# Patient Record
Sex: Male | Born: 1995 | Race: Black or African American | Hispanic: No | Marital: Single | Smoking: Current some day smoker
Health system: Southern US, Community
[De-identification: ages and names within clinical notes are randomized; demographics above are authoritative.]

## PROBLEM LIST (undated history)

## (undated) DIAGNOSIS — F32A Depression, unspecified: Secondary | ICD-10-CM

## (undated) DIAGNOSIS — F329 Major depressive disorder, single episode, unspecified: Secondary | ICD-10-CM

## (undated) HISTORY — PX: TESTICULAR EXPLORATION: SHX5145

## (undated) HISTORY — DX: Depression, unspecified: F32.A

## (undated) HISTORY — DX: Major depressive disorder, single episode, unspecified: F32.9

---
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
## 2014-02-03 ENCOUNTER — Emergency Department: Payer: Self-pay | Admitting: Emergency Medicine

## 2014-02-03 LAB — COMPREHENSIVE METABOLIC PANEL
ALBUMIN: 4.8 g/dL (ref 3.8–5.6)
ALK PHOS: 39 U/L — AB
Anion Gap: 6 — ABNORMAL LOW (ref 7–16)
BUN: 14 mg/dL (ref 7–18)
Bilirubin,Total: 0.6 mg/dL (ref 0.2–1.0)
CALCIUM: 9.4 mg/dL (ref 9.0–10.7)
Chloride: 109 mmol/L — ABNORMAL HIGH (ref 98–107)
Co2: 25 mmol/L (ref 21–32)
Creatinine: 1.26 mg/dL (ref 0.60–1.30)
EGFR (African American): >60
Glucose: 92 mg/dL (ref 65–99)
Osmolality: 280 (ref 275–301)
POTASSIUM: 3.6 mmol/L (ref 3.5–5.1)
SGOT(AST): 19 U/L (ref 10–41)
SGPT (ALT): 27 U/L
SODIUM: 140 mmol/L (ref 136–145)
Total Protein: 7.9 g/dL (ref 6.4–8.6)

## 2014-02-03 LAB — CBC WITH DIFFERENTIAL/PLATELET
Basophil #: 0 10*3/uL (ref 0.0–0.1)
Basophil %: 1.3 %
EOS ABS: 0.1 10*3/uL (ref 0.0–0.7)
EOS PCT: 1.8 %
HCT: 44.6 % (ref 40.0–52.0)
HGB: 13.3 g/dL (ref 13.0–18.0)
Lymphocyte #: 1.3 10*3/uL (ref 1.0–3.6)
Lymphocyte %: 35.7 %
MCH: 20.5 pg — ABNORMAL LOW (ref 26.0–34.0)
MCHC: 29.8 g/dL — ABNORMAL LOW (ref 32.0–36.0)
MCV: 69 fL — ABNORMAL LOW (ref 80–100)
MONO ABS: 0.4 x10 3/mm (ref 0.2–1.0)
Monocyte %: 10.6 %
Neutrophil #: 1.8 10*3/uL (ref 1.4–6.5)
Neutrophil %: 50.6 %
Platelet: 251 10*3/uL (ref 150–440)
RBC: 6.51 10*6/uL — ABNORMAL HIGH (ref 4.40–5.90)
RDW: 15.2 % — ABNORMAL HIGH (ref 11.5–14.5)
WBC: 3.6 10*3/uL — ABNORMAL LOW (ref 3.8–10.6)

## 2014-02-03 LAB — URINALYSIS, COMPLETE
BACTERIA: NONE SEEN
BLOOD: NEGATIVE
Bilirubin,UR: NEGATIVE
Glucose,UR: NEGATIVE mg/dL (ref 0–75)
KETONE: NEGATIVE
Leukocyte Esterase: NEGATIVE
Nitrite: NEGATIVE
Ph: 7 (ref 4.5–8.0)
Protein: 30
Specific Gravity: 1.029 (ref 1.003–1.030)
Squamous Epithelial: 2

## 2014-07-07 DIAGNOSIS — N529 Male erectile dysfunction, unspecified: Secondary | ICD-10-CM | POA: Insufficient documentation

## 2014-07-07 DIAGNOSIS — Z87438 Personal history of other diseases of male genital organs: Secondary | ICD-10-CM | POA: Insufficient documentation

## 2014-07-07 DIAGNOSIS — N50819 Testicular pain, unspecified: Secondary | ICD-10-CM | POA: Insufficient documentation

## 2014-09-08 ENCOUNTER — Emergency Department: Admit: 2014-09-08 | Discharge status: Against Medical Advice | Payer: Self-pay | Admitting: Emergency Medicine

## 2014-09-08 LAB — CBC WITH DIFFERENTIAL/PLATELET
Basophil #: 0.1 x10 3/mm 3
Basophil %: 1.2 %
Eosinophil #: 0.1 x10 3/mm 3
Eosinophil %: 1.4 %
HCT: 45.4 %
HGB: 14 g/dL
Lymphocyte %: 26.3 %
Lymphs Abs: 1.1 x10 3/mm 3
MCH: 21 pg — ABNORMAL LOW
MCHC: 30.9 g/dL — ABNORMAL LOW
MCV: 68 fL — ABNORMAL LOW
Monocyte #: 0.5 "x10 3/mm "
Monocyte %: 12 %
Neutrophil #: 2.5 x10 3/mm 3
Neutrophil %: 59.1 %
Platelet: 283 x10 3/mm 3
RBC: 6.68 x10 6/mm 3 — ABNORMAL HIGH
RDW: 16.3 % — ABNORMAL HIGH
WBC: 4.2 x10 3/mm 3

## 2014-09-08 LAB — COMPREHENSIVE METABOLIC PANEL
ALT: 31 U/L
ANION GAP: 10 (ref 7–16)
Albumin: 5.3 g/dL — ABNORMAL HIGH
Alkaline Phosphatase: 37 U/L — ABNORMAL LOW
BILIRUBIN TOTAL: 0.5 mg/dL
BUN: 16 mg/dL
CHLORIDE: 106 mmol/L
CREATININE: 1.36 mg/dL — AB
Calcium, Total: 9.9 mg/dL
Co2: 26 mmol/L
EGFR (African American): >60
GLUCOSE: 105 mg/dL — AB
Potassium: 3.8 mmol/L
SGOT(AST): 37 U/L
Sodium: 142 mmol/L
TOTAL PROTEIN: 8.2 g/dL — AB

## 2014-09-08 LAB — URINALYSIS, COMPLETE
BILIRUBIN, UR: NEGATIVE
Bacteria: NONE SEEN
Blood: NEGATIVE
Glucose,UR: NEGATIVE mg/dL (ref 0–75)
KETONE: NEGATIVE
LEUKOCYTE ESTERASE: NEGATIVE
Nitrite: NEGATIVE
Ph: 5 (ref 4.5–8.0)
Protein: NEGATIVE
Specific Gravity: 1.03 (ref 1.003–1.030)

## 2014-09-08 LAB — LIPASE, BLOOD: Lipase: 61 U/L — ABNORMAL HIGH

## 2014-11-04 ENCOUNTER — Emergency Department
Admission: EM | Admit: 2014-11-04 | Discharge: 2014-11-04 | Disposition: A | Discharge status: Home / Self Care | Payer: BLUE CROSS/BLUE SHIELD | Attending: Emergency Medicine | Admitting: Emergency Medicine

## 2014-11-04 ENCOUNTER — Emergency Department: Payer: BLUE CROSS/BLUE SHIELD

## 2014-11-04 ENCOUNTER — Encounter: Payer: Self-pay | Admitting: *Deleted

## 2014-11-04 DIAGNOSIS — R634 Abnormal weight loss: Secondary | ICD-10-CM | POA: Diagnosis not present

## 2014-11-04 DIAGNOSIS — Z720 Tobacco use: Secondary | ICD-10-CM | POA: Diagnosis not present

## 2014-11-04 DIAGNOSIS — Z79899 Other long term (current) drug therapy: Secondary | ICD-10-CM | POA: Insufficient documentation

## 2014-11-04 DIAGNOSIS — R112 Nausea with vomiting, unspecified: Secondary | ICD-10-CM

## 2014-11-04 LAB — COMPREHENSIVE METABOLIC PANEL
ALT: 20 U/L (ref 17–63)
AST: 21 U/L (ref 15–41)
Albumin: 4.8 g/dL (ref 3.5–5.0)
Alkaline Phosphatase: 36 U/L — ABNORMAL LOW (ref 38–126)
Anion gap: 12 (ref 5–15)
BUN: 16 mg/dL (ref 6–20)
CALCIUM: 9.4 mg/dL (ref 8.9–10.3)
CO2: 26 mmol/L (ref 22–32)
Chloride: 103 mmol/L (ref 101–111)
Creatinine, Ser: 1.42 mg/dL — ABNORMAL HIGH (ref 0.61–1.24)
GFR calc Af Amer: >60 mL/min (ref >60)
Glucose, Bld: 92 mg/dL (ref 65–99)
Potassium: 3.9 mmol/L (ref 3.5–5.1)
SODIUM: 141 mmol/L (ref 135–145)
Total Bilirubin: 0.7 mg/dL (ref 0.3–1.2)
Total Protein: 7.6 g/dL (ref 6.5–8.1)

## 2014-11-04 LAB — CBC WITH DIFFERENTIAL/PLATELET
BASOS PCT: 0 %
Basophils Absolute: 0 10*3/uL (ref 0–0.1)
EOS ABS: 0.1 10*3/uL (ref 0–0.7)
Eosinophils Relative: 2 %
HEMATOCRIT: 42.6 % (ref 40.0–52.0)
Hemoglobin: 13.3 g/dL (ref 13.0–18.0)
Lymphocytes Relative: 37 %
Lymphs Abs: 1.5 10*3/uL (ref 1.0–3.6)
MCH: 21 pg — AB (ref 26.0–34.0)
MCHC: 31.1 g/dL — AB (ref 32.0–36.0)
MCV: 67.6 fL — AB (ref 80.0–100.0)
MONO ABS: 0.4 10*3/uL (ref 0.2–1.0)
Monocytes Relative: 10 %
NEUTROS ABS: 2.1 10*3/uL (ref 1.4–6.5)
Neutrophils Relative %: 51 %
Platelets: 239 10*3/uL (ref 150–440)
RBC: 6.31 MIL/uL — ABNORMAL HIGH (ref 4.40–5.90)
RDW: 16 % — AB (ref 11.5–14.5)
WBC: 4.1 10*3/uL (ref 3.8–10.6)

## 2014-11-04 LAB — LIPASE, BLOOD: Lipase: 46 U/L (ref 22–51)

## 2014-11-04 MED ORDER — METOCLOPRAMIDE HCL 5 MG PO TABS
5.0000 mg | ORAL_TABLET | Freq: Once | ORAL | Status: AC
  Administered 2014-11-04: 5 mg via ORAL
  Filled 2014-11-04: qty 1

## 2014-11-04 MED ORDER — METOCLOPRAMIDE HCL 5 MG PO TABS: 5.0000 mg | ORAL_TABLET | Freq: Three times a day (TID) | ORAL | Status: DC

## 2014-11-04 NOTE — ED Provider Notes (Signed)
[facility] Emergency Department Provider Note  ____________________________________________  Time seen: 1920  I have reviewed the triage vital signs and the nursing notes.   HISTORY  Chief Complaint Emesis     HPI Francisco [name] is a 19 y.o. male who reports that for the past 2-3 months he has had a problem where he sometimes vomits after he eats. He reports he does not have much nausea. The emesis sneaks up on him somewhat. This is sometimes 30 minutes after a meal. He feels he is throwing up the entire portion of food that he ate and not just a part of the serving. He will then have some ongoing nausea which resolves.    in addition to this emesis issue, he often feels as though he is getting full early when eating a meal. He denies any abdominal pain. He is having normal bowel movements without constipation.  Francisco [name] reports that he has dropped nearly 40 pounds in 3-4 months. He is gone from 230 pounds to approximately 190.   History reviewed. No pertinent past medical history.  There are no active problems to display for this patient.   Past Surgical History  Procedure Laterality Date  . Testicular exploration      Current Outpatient Rx  Name  Route  Sig  Dispense  Refill  . metoCLOPramide (REGLAN) 5 MG tablet   Oral   Take 1 tablet (5 mg total) by mouth 3 (three) times daily.   15 tablet   0     Allergies Review of patient's allergies indicates no known allergies.  History reviewed. No pertinent family history.  Social History History  Substance Use Topics  . Smoking status: Current Every Day Smoker    Types: Cigarettes  . Smokeless tobacco: Never Used  . Alcohol Use: No    Review of Systems  Constitutional: Negative for fever. ENT: Negative for sore throat. Cardiovascular: Negative for chest pain. Respiratory: Negative for shortness of breath. Gastrointestinal: Vomiting after eating, limited appetite, see history of  present illness. Genitourinary: Patient reports he has had difficulty with erection for approximately 1 year. Musculoskeletal: No myalgias or injuries. Skin: Negative for rash. Neurological: Negative for headaches   10-point ROS otherwise negative.  ____________________________________________   PHYSICAL EXAM:  VITAL SIGNS: ED Triage Vitals  Enc Vitals Group     BP 11/04/14 1735 152/80 mmHg     Pulse Rate 11/04/14 1735 65     Resp 11/04/14 1735 16     Temp 11/04/14 1735 98.2 F (36.8 C)     Temp Source 11/04/14 1735 Oral     SpO2 11/04/14 1735 100 %     Weight 11/04/14 1735 191 lb (86.637 kg)     Height 11/04/14 1735 5\' 8"  (1.727 m)     Head Cir --      Peak Flow --      Pain Score --      Pain Loc --      Pain Edu? --      Excl. in GC? --     Constitutional: Alert and oriented. Well appearing and in no distress. ENT   Head: Normocephalic and atraumatic.   Nose: No congestion/rhinnorhea.   Mouth/Throat: Mucous membranes are moist. Cardiovascular: Normal rate, regular rhythm, no murmur noted Respiratory:  Normal respiratory effort, no tachypnea.    Breath sounds are clear and equal bilaterally.  Gastrointestinal: Soft and nontender. No distention.  Back: No muscle spasm, no tenderness, no CVA tenderness.  Musculoskeletal: No deformity noted. Nontender with normal range of motion in all extremities.  No noted edema. Neurologic:  Normal speech and language. No gross focal neurologic deficits are appreciated.  Skin:  Skin is warm, dry. No rash noted. Psychiatric: Mood and affect are normal. Speech and behavior are normal.  ____________________________________________    LABS (pertinent positives/negatives)  CBC: White blood cell count 4.1, hemoglobin 13.3 Metabolic panel: Within normal limits except for slight elevation of creatinine at 1.42, LFTs are normal. Lipase: Normal at 46   ____________________________________________    RADIOLOGY  Abdominal  x-ray: IMPRESSION: Negative abdominal radiographs. No acute cardiopulmonary disease.  ____________________________________________   INITIAL IMPRESSION / ASSESSMENT AND PLAN / ED COURSE  Pertinent labs & imaging results that were available during my care of the patient were reviewed by me and considered in my medical decision making (see chart for details).   This patient overall looks well. He is in no acute distress. We will obtain an abdominal x-ray has a reasonable preliminary imaging. If this is unremarkable, we will discharge him for outpatient follow-up. I discussed how he will need to see a gastroenterologist for ongoing care. Reglan may help him with motility. We will give him 5 mg now and a prescription to take before meals. I counseled him to keep his meals to a small size. He tells me he has gone to [facility] in the past, last seen last year,  and he can follow-up with them.  ----------------------------------------- 8:17 PM on 11/04/2014 -----------------------------------------  Labs and an abdominal x-ray of the patient appeared generally normal. We will prescribe Reglan and have him follow with [facility] with the possibility of follow-up with gastroenterology.  ____________________________________________   FINAL CLINICAL IMPRESSION(S) / ED DIAGNOSES  Final diagnoses:  Non-intractable vomiting with nausea, vomiting of unspecified type  Weight loss, unintentional      Darien Ramus, MD 11/04/14 2021

## 2014-11-04 NOTE — ED Notes (Signed)
Pt states that everytime he eats he has to vomit for a couple months now. Pt reports going from 230-191lb. Pt denies abd pain

## 2014-11-04 NOTE — Discharge Instructions (Signed)
It is unclear why you are vomiting when you eat. He may need see gastroenterologist and have endoscopy. Take Reglan 30 minutes before you eat. Keep your meals small. Drink fluids. Follow-up with [facility]. They could help arrange further care for you.  Return to the emergency department if you can't keep anything down, have ongoing vomiting, have ongoing abdominal pain, or if you have other urgent concerns.  Nausea and Vomiting Nausea means you feel sick to your stomach. Throwing up (vomiting) is a reflex where stomach contents come out of your mouth. HOME CARE   Take medicine as told by your doctor.  Do not force yourself to eat. However, you do need to drink fluids.  If you feel like eating, eat a normal diet as told by your doctor.  Eat rice, wheat, potatoes, bread, lean meats, yogurt, fruits, and vegetables.  Avoid high-fat foods.  Drink enough fluids to keep your pee (urine) clear or pale yellow.  Ask your doctor how to replace body fluid losses (rehydrate). Signs of body fluid loss (dehydration) include:  Feeling very thirsty.  Dry lips and mouth.  Feeling dizzy.  Dark pee.  Peeing less than normal.  Feeling confused.  Fast breathing or heart rate. GET HELP RIGHT AWAY IF:   You have blood in your throw up.  You have black or bloody poop (stool).  You have a bad headache or stiff neck.  You feel confused.  You have bad belly (abdominal) pain.  You have chest pain or trouble breathing.  You do not pee at least once every 8 hours.  You have cold, clammy skin.  You keep throwing up after 24 to 48 hours.  You have a fever. MAKE SURE YOU:   Understand these instructions.  Will watch your condition.  Will get help right away if you are not doing well or get worse. Document Released: 10/17/2007 Document Revised: 07/23/2011 Document Reviewed: 09/29/2010 [facility] Patient Information 2015 [employer], [place]. This information is not intended to replace advice  given to you by your health care provider. Make sure you discuss any questions you have with your health care provider.

## 2014-11-04 NOTE — ED Notes (Addendum)
Patient states he has been having difficulty eating as much as he used to or would vomit afterwards. Patient states he has lost weight from 230lbs to 191lbs in approximately two months. Patient denies pain with the episodes. Patient c/o nausea intermittently.

## 2015-01-06 ENCOUNTER — Encounter: Payer: Self-pay | Admitting: Emergency Medicine

## 2015-01-06 ENCOUNTER — Emergency Department
Admission: EM | Admit: 2015-01-06 | Discharge: 2015-01-07 | Disposition: A | Discharge status: Other Acute Inpatient Hospital | Payer: BLUE CROSS/BLUE SHIELD | Attending: Emergency Medicine | Admitting: Emergency Medicine

## 2015-01-06 DIAGNOSIS — Z720 Tobacco use: Secondary | ICD-10-CM | POA: Insufficient documentation

## 2015-01-06 DIAGNOSIS — R45851 Suicidal ideations: Secondary | ICD-10-CM

## 2015-01-06 DIAGNOSIS — Z79899 Other long term (current) drug therapy: Secondary | ICD-10-CM | POA: Insufficient documentation

## 2015-01-06 DIAGNOSIS — F1210 Cannabis abuse, uncomplicated: Secondary | ICD-10-CM | POA: Diagnosis not present

## 2015-01-06 DIAGNOSIS — F4325 Adjustment disorder with mixed disturbance of emotions and conduct: Secondary | ICD-10-CM | POA: Diagnosis not present

## 2015-01-06 DIAGNOSIS — F919 Conduct disorder, unspecified: Secondary | ICD-10-CM | POA: Diagnosis present

## 2015-01-06 NOTE — ED Notes (Signed)
Pt. Brought in by [employer].  Pt. Denies pain at this time.  Pt. States "I have a lot of things going on".

## 2015-01-07 DIAGNOSIS — F4325 Adjustment disorder with mixed disturbance of emotions and conduct: Secondary | ICD-10-CM

## 2015-01-07 LAB — CBC
HCT: 42.9 % (ref 40.0–52.0)
HEMOGLOBIN: 13.4 g/dL (ref 13.0–18.0)
MCH: 21 pg — ABNORMAL LOW (ref 26.0–34.0)
MCHC: 31.3 g/dL — ABNORMAL LOW (ref 32.0–36.0)
MCV: 67 fL — ABNORMAL LOW (ref 80.0–100.0)
Platelets: 263 10*3/uL (ref 150–440)
RBC: 6.4 MIL/uL — AB (ref 4.40–5.90)
RDW: 16.1 % — ABNORMAL HIGH (ref 11.5–14.5)
WBC: 7.9 10*3/uL (ref 3.8–10.6)

## 2015-01-07 LAB — URINE DRUG SCREEN, QUALITATIVE (ARMC ONLY)
AMPHETAMINES, UR SCREEN: NOT DETECTED
Amphetamines, Ur Screen: NOT DETECTED
Barbiturates, Ur Screen: NOT DETECTED
Barbiturates, Ur Screen: NOT DETECTED
Benzodiazepine, Ur Scrn: NOT DETECTED
Benzodiazepine, Ur Scrn: NOT DETECTED
COCAINE METABOLITE, UR ~~LOC~~: NOT DETECTED
Cannabinoid 50 Ng, Ur ~~LOC~~: POSITIVE — AB
Cannabinoid 50 Ng, Ur ~~LOC~~: POSITIVE — AB
Cocaine Metabolite,Ur ~~LOC~~: NOT DETECTED
MDMA (ECSTASY) UR SCREEN: NOT DETECTED
MDMA (ECSTASY) UR SCREEN: NOT DETECTED
METHADONE SCREEN, URINE: NOT DETECTED
METHADONE SCREEN, URINE: NOT DETECTED
Opiate, Ur Screen: NOT DETECTED
Opiate, Ur Screen: NOT DETECTED
Phencyclidine (PCP) Ur S: NOT DETECTED
Phencyclidine (PCP) Ur S: NOT DETECTED
TRICYCLIC, UR SCREEN: NOT DETECTED
Tricyclic, Ur Screen: NOT DETECTED

## 2015-01-07 LAB — COMPREHENSIVE METABOLIC PANEL
ALBUMIN: 5.1 g/dL — AB (ref 3.5–5.0)
ALK PHOS: 35 U/L — AB (ref 38–126)
ALT: 16 U/L — AB (ref 17–63)
ANION GAP: 8 (ref 5–15)
AST: 21 U/L (ref 15–41)
BUN: 16 mg/dL (ref 6–20)
CHLORIDE: 109 mmol/L (ref 101–111)
CO2: 23 mmol/L (ref 22–32)
Calcium: 9.6 mg/dL (ref 8.9–10.3)
Creatinine, Ser: 1.19 mg/dL (ref 0.61–1.24)
GFR calc non Af Amer: >60 mL/min (ref >60)
Glucose, Bld: 92 mg/dL (ref 65–99)
Potassium: 3.5 mmol/L (ref 3.5–5.1)
SODIUM: 140 mmol/L (ref 135–145)
Total Bilirubin: 0.7 mg/dL (ref 0.3–1.2)
Total Protein: 8.1 g/dL (ref 6.5–8.1)

## 2015-01-07 LAB — SALICYLATE LEVEL

## 2015-01-07 LAB — ACETAMINOPHEN LEVEL

## 2015-01-07 LAB — ETHANOL: Alcohol, Ethyl (B): <5 mg/dL (ref <5)

## 2015-01-07 NOTE — ED Provider Notes (Signed)
-----------------------------------------   4:01 PM on 01/07/2015 -----------------------------------------   Blood pressure 140/78, pulse 78, temperature 98.9 F (37.2 C), temperature source Oral, resp. rate 18, SpO2 100 %.  Dr. Toni Amend evaluated the patient personally and feels that he is safe for discharge.  He has revoked the involuntary commitment.  The patient can follow-up with [facility].  Loleta Rose, MD 01/07/15 [phone]

## 2015-01-07 NOTE — ED Notes (Signed)
Patient discharged ambulatory to care of family. He denies SI or HI. Received all personal belongings.

## 2015-01-07 NOTE — ED Notes (Signed)
Patient resting comfortably in room. No complaints or concerns voiced. No distress or abnormal behavior noted. Will continue to monitor with security cameras. Q 15 minute rounds continue. 

## 2015-01-07 NOTE — ED Notes (Signed)
ENVIRONMENTAL ASSESSMENT Potentially harmful objects out of patient reach: Yes Personal belongings secured: Yes Patient dressed in hospital provided attire only: Yes Plastic bags out of patient reach: Yes Patient care equipment (cords, cables, call bells, lines, and drains) shortened, removed, or accounted for: Yes Equipment and supplies removed from bottom of stretcher: Yes Potentially toxic materials out of patient reach: N/A Sharps container removed or out of patient reach: Yes

## 2015-01-07 NOTE — ED Notes (Signed)
Report called to RN Jillyn Hidden in [place]. Pt transferred to [facility] with ED Roque Cash and ODS Officer Earl Lites.

## 2015-01-07 NOTE — ED Notes (Signed)
Pt. Noted sleeping in room. No complaints or concerns voiced. No distress or abnormal behavior noted. Will continue to monitor with security cameras. Q 15 minute rounds continue. 

## 2015-01-07 NOTE — BH Assessment (Signed)
Assessment Note  Francisco [name] is an 19 y.o. male presenting to the ED under IVC for suicidal ideations with a plan and intent to cut his wrists.  Pt reports multiple stressors: lack of income, job loss, recent death of grandmother, concern over medical issues and relationship issues with his girlfriend.  During the assessment, pt repeatedly stated that "I'm calm now and I want to go home".  Pt denies any drug/alcohol use.  Pt has not had any previous psychiatric hospitalization.  Pt agreed that he should seek out outpatient therapy to help cope with the difficulties he's experiencing.  Axis I: Anxiety Disorder NOS and Depressive Disorder NOS Axis II: Deferred Axis III: History reviewed. No pertinent past medical history. Axis IV: economic problems and problems with primary support group Axis V: 61-70 mild symptoms  Past Medical History: History reviewed. No pertinent past medical history.  Past Surgical History  Procedure Laterality Date  . Testicular exploration      Family History: History reviewed. No pertinent family history.  Social History:  reports that he has been smoking Cigarettes.  He has been smoking about 0.50 packs per day. He has never used smokeless tobacco. He reports that he does not drink alcohol or use illicit drugs.  Additional Social History:  Alcohol / Drug Use History of alcohol / drug use?: No history of alcohol / drug abuse  CIWA:   COWS:    Allergies: No Known Allergies  Home Medications:  (Not in a hospital admission)  OB/GYN Status:  No LMP for male patient.  General Assessment Data Location of Assessment: [facility] ED TTS Assessment: In system Is this a Tele or Face-to-Face Assessment?: Face-to-Face Is this an Initial Assessment or a Re-assessment for this encounter?: Initial Assessment Marital status: Single Maiden name: N/A Is patient pregnant?: No Pregnancy Status: No Living Arrangements: Parent Can pt return to current living arrangement?:  Yes Admission Status: Involuntary Is patient capable of signing voluntary admission?: No Referral Source: Self/Family/Friend Insurance type: BC/BS     Crisis Care Plan Living Arrangements: Parent Name of Psychiatrist: None Reported Name of Therapist: None Reported  Education Status Is patient currently in school?: No Current Grade: N/A Highest grade of school patient has completed: 12 Name of school: [employer] person: N/A  Risk to self with the past 6 months Suicidal Ideation: Yes-Currently Present Has patient been a risk to self within the past 6 months prior to admission? : Yes Suicidal Intent: Yes-Currently Present Has patient had any suicidal intent within the past 6 months prior to admission? : Yes Is patient at risk for suicide?: Yes Suicidal Plan?: Yes-Currently Present Has patient had any suicidal plan within the past 6 months prior to admission? : Yes Specify Current Suicidal Plan: Pt had a plan to cut his wrist. Access to Means: Yes Specify Access to Suicidal Means: Patient has access to knives What has been your use of drugs/alcohol within the last 12 months?: None reported Previous Attempts/Gestures: No How many times?: 0 Other Self Harm Risks: None reported Triggers for Past Attempts: None known Intentional Self Injurious Behavior: None Family Suicide History: No Recent stressful life event(s): Job Loss, Financial Problems, Loss (Comment), Other (Comment) (Relationship issues, death of grandmother) Persecutory voices/beliefs?: No Depression: Yes Depression Symptoms: Despondent, Loss of interest in usual pleasures, Feeling worthless/self pity, Feeling angry/irritable Substance abuse history and/or treatment for substance abuse?: No Suicide prevention information given to non-admitted patients: Not applicable  Risk to Others within the past 6 months Homicidal  Ideation: No Does patient have any lifetime risk of violence toward others beyond the  six months prior to admission? : No Thoughts of Harm to Others: No Current Homicidal Intent: No Current Homicidal Plan: No Access to Homicidal Means: No Identified Victim: None reported History of harm to others?: No Assessment of Violence: On admission Violent Behavior Description: None reported Does patient have access to weapons?: Yes (Comment) Criminal Charges Pending?: No Does patient have a court date: No Is patient on probation?: No  Psychosis Hallucinations: None noted Delusions: None noted  Mental Status Report Appearance/Hygiene: In scrubs Eye Contact: Good Motor Activity: Restlessness, Freedom of movement, Agitation Speech: Logical/coherent, Soft Level of Consciousness: Restless, Alert Mood: Anxious, Suspicious, Apprehensive, Irritable Affect: Anxious, Irritable Anxiety Level: Minimal Thought Processes: Coherent, Relevant Judgement: Partial Orientation: Person, Place, Time, Situation Obsessive Compulsive Thoughts/Behaviors: None  Cognitive Functioning Concentration: Normal Memory: Recent Intact IQ: Average Insight: Fair Impulse Control: Poor Appetite: Good Weight Loss: 0 Weight Gain: 0 Sleep: No Change Total Hours of Sleep: 8 Vegetative Symptoms: None  ADLScreening [facility] Assessment Services) Patient's cognitive ability adequate to safely complete daily activities?: Yes Patient able to express need for assistance with ADLs?: Yes Independently performs ADLs?: Yes (appropriate for developmental age)  Prior Inpatient Therapy Prior Inpatient Therapy: No  Prior Outpatient Therapy Prior Outpatient Therapy: No Does patient have an ACCT team?: No Does patient have Intensive In-House Services?  : No Does patient have [facility] services? : No Does patient have P4CC services?: No  ADL Screening (condition at time of admission) Patient's cognitive ability adequate to safely complete daily activities?: Yes Patient able to express need for assistance with ADLs?:  Yes Independently performs ADLs?: Yes (appropriate for developmental age)       Abuse/Neglect Assessment (Assessment to be complete while patient is alone) Physical Abuse: Denies Verbal Abuse: Denies Sexual Abuse: Denies Exploitation of patient/patient's resources: Denies Self-Neglect: Denies Values / Beliefs Cultural Requests During Hospitalization: None Spiritual Requests During Hospitalization: None Consults Spiritual Care Consult Needed: No Social Work Consult Needed: No Merchant navy officer (For Healthcare) Does patient have an advance directive?: No    Additional Information 1:1 In Past 12 Months?: No CIRT Risk: No Elopement Risk: No     Disposition:  Disposition Initial Assessment Completed for this Encounter: Yes Disposition of Patient: Other dispositions Other disposition(s): Other (Comment) (Psych MD Consult)  On Site Evaluation by:   Reviewed with Physician:    Tashona Calk C Samary Shatz 01/07/2015 1:05 AM

## 2015-01-07 NOTE — BHH Counselor (Signed)
Per request oToni AmendYC MD (Dr. Clapacs), writer provided the pt. with information and instructions on how to access Out Pt. Mental Health Treatment ([facility] and [employer])

## 2015-01-07 NOTE — ED Notes (Signed)
Patient visited with mother. Visit did not go well as patient blaming mother for IVC. Visit was cut short by RN. Patient was able to maintain in behavioral control with encouragement.

## 2015-01-07 NOTE — ED Notes (Signed)
Pt in room. No complaints or concerns voiced at this time. No abnormal behavior noted at this time. Will continue to monitor with q15 min checks. ODS officer in area. 

## 2015-01-07 NOTE — ED Notes (Signed)
BEHAVIORAL HEALTH ROUNDING Patient sleeping: No. Patient alert and oriented: yes Behavior appropriate: Yes.   Nutrition and fluids offered: Yes  Toileting and hygiene offered: Yes  Sitter present: q15 min observations Law enforcement present: Yes [employer] 

## 2015-01-07 NOTE — ED Notes (Signed)
Clapacs with pt  

## 2015-01-07 NOTE — ED Provider Notes (Signed)
[facility] Emergency Department Provider Note  ____________________________________________  Time seen: 1:00 AM  I have reviewed the triage vital signs and the nursing notes.   HISTORY  Chief Complaint Behavior Problem      HPI Francisco [name] is a 19 y.o. male presents via [employer] Department involuntarily committed for suicidal ideation. Patient admits to what is dated on the involuntary commitment namely that the patient had a knife and was threatening to cut his wrists. Patient states that he no longer has any suicidal ideation. Patient states that he was dealing with "the death of his grandmother and the imminent death of his other grandmother very poorly". Patient is requesting to be discharged stating that he is "fine now, I just didn't handle it well".     History reviewed. No pertinent past medical history.  There are no active problems to display for this patient.   Past Surgical History  Procedure Laterality Date  . Testicular exploration      Current Outpatient Rx  Name  Route  Sig  Dispense  Refill  . metoCLOPramide (REGLAN) 5 MG tablet   Oral   Take 1 tablet (5 mg total) by mouth 3 (three) times daily.   15 tablet   0     Allergies Review of patient's allergies indicates no known allergies.  History reviewed. No pertinent family history.  Social History Social History  Substance Use Topics  . Smoking status: Current Every Day Smoker -- 0.50 packs/day    Types: Cigarettes  . Smokeless tobacco: Never Used  . Alcohol Use: No    Review of Systems  Constitutional: Negative for fever. Eyes: Negative for visual changes. ENT: Negative for sore throat. Cardiovascular: Negative for chest pain. Respiratory: Negative for shortness of breath. Gastrointestinal: Negative for abdominal pain, vomiting and diarrhea. Genitourinary: Negative for dysuria. Musculoskeletal: Negative for back pain. Skin: Negative for  rash. Neurological: Negative for headaches, focal weakness or numbness. Psychiatric: Depression  10-point ROS otherwise negative.  ____________________________________________   PHYSICAL EXAM:  VITAL SIGNS: ED Triage Vitals  Enc Vitals Group     BP --      Pulse --      Resp --      Temp --      Temp src --      SpO2 --      Weight --      Height --      Head Cir --      Peak Flow --      Pain Score 01/06/15 2251 0     Pain Loc --      Pain Edu? --      Excl. in GC? --      Constitutional: Alert and oriented. Well appearing and in no distress. Eyes: Conjunctivae are normal. PERRL. Normal extraocular movements. ENT   Head: Normocephalic and atraumatic.   Nose: No congestion/rhinnorhea.   Mouth/Throat: Mucous membranes are moist.   Neck: No stridor. Hematological/Lymphatic/Immunilogical: No cervical lymphadenopathy. Cardiovascular: Normal rate, regular rhythm. Normal and symmetric distal pulses are present in all extremities. No murmurs, rubs, or gallops. Respiratory: Normal respiratory effort without tachypnea nor retractions. Breath sounds are clear and equal bilaterally. No wheezes/rales/rhonchi. Gastrointestinal: Soft and nontender. No distention. There is no CVA tenderness. Genitourinary: deferred Musculoskeletal: Nontender with normal range of motion in all extremities. No joint effusions.  No lower extremity tenderness nor edema. Neurologic:  Normal speech and language. No gross focal neurologic deficits are appreciated. Speech is  normal.  Skin:  Skin is warm, dry and intact. No rash noted. Psychiatric: Mood and affect are normal. Speech and behavior are normal. Patient exhibits appropriate insight and judgment.  ____________________________________________    LABS (pertinent positives/negatives)  Labs Reviewed  URINE DRUG SCREEN, QUALITATIVE ([facility] ONLY) - Abnormal; Notable for the following:    Cannabinoid 50 Ng, Ur [place] POSITIVE (*)    All  other components within normal limits  COMPREHENSIVE METABOLIC PANEL  ETHANOL  SALICYLATE LEVEL  ACETAMINOPHEN LEVEL  CBC  URINE RAPID DRUG SCREEN, HOSP PERFORMED       INITIAL IMPRESSION / ASSESSMENT AND PLAN / ED COURSE  Pertinent labs & imaging results that were available during my care of the patient were reviewed by me and considered in my medical decision making (see chart for details). Patient was evaluated by specialist on call psychiatry with recommendation for hospitalization admission.   ____________________________________________   FINAL CLINICAL IMPRESSION(S) / ED DIAGNOSES  Final diagnoses:  Suicidal ideation      Darci Current, MD 01/07/15 [phone]

## 2015-01-07 NOTE — ED Notes (Signed)
Patient showered. In no apparent distress. Cooperative with nursing interventions.

## 2015-01-07 NOTE — Discharge Instructions (Signed)
You have been seen in the Emergency Department (ED) today for a psychiatric complaint.  You have been evaluated by psychiatry and we believe you are safe to be discharged from the hospital.    Please return to the ED immediately if you have ANY thoughts of hurting yourself or anyone else, so that we may help you.  Please avoid alcohol and drug use.  Follow up with your doctor and/or therapist as soon as possible regarding today's ED visit.   Please follow up any other recommendations and clinic appointments provided by the psychiatry team that saw you in the Emergency Department.   Adjustment Disorder Most changes in life can cause stress. Getting used to changes may take a few months or longer. If feelings of stress, hopelessness, or worry continue, you may have an adjustment disorder. This stress-related mental health problem may affect your feelings, thinking and how you act. It occurs in both sexes and happens at any age. SYMPTOMS  Some of the following problems may be seen and vary from person to person:  Sadness or depression.  Loss of enjoyment.  Thoughts of suicide.  Fighting.  Avoiding family and friends.  Poor school performance.  Hopelessness, sense of loss.  Trouble sleeping.  Vandalism.  Worry, weight loss or gain.  Crying spells.  Anxiety  Reckless driving.  Skipping school.  Poor work International aid/development worker.  Nervousness.  Ignoring bills.  Poor attitude. DIAGNOSIS  Your caregiver will ask what has happened in your life and do a physical exam. They will make a diagnosis of an adjustment disorder when they are sure another problem or medical illness causing your feelings does not exist. TREATMENT  When problems caused by stress interfere with you daily life or last longer than a few months, you may need counseling for an adjustment disorder. Early treatment may diminish problems and help you to better cope with the stressful events in your life. Sometimes  medication is necessary. Individual counseling and or support groups can be very helpful. PROGNOSIS  Adjustment disorders usually last less than 3 to 6 months. The condition may persist if there is long lasting stress. This could include health problems, relationship problems, or job difficulties where you can not easily escape from what is causing the problem. PREVENTION  Even the most mentally healthy, highly functioning people can suffer from an adjustment disorder given a significant blow from a life-changing event. There is no way to prevent pain and loss. Most people need help from time to time. You are not alone. SEEK MEDICAL CARE IF:  Your feelings or symptoms listed above do not improve or worsen. Document Released: 01/02/2006 Document Revised: 07/23/2011 Document Reviewed: 03/26/2007 [employer] Patient Information 2015 [place], [place]. This information is not intended to replace advice given to you by your health care provider. Make sure you discuss any questions you have with your health care provider.  Depression Depression is feeling sad, low, down in the dumps, blue, gloomy, or empty. In general, there are two kinds of depression:  Normal sadness or grief. This can happen after something upsetting. It often goes away on its own within 2 weeks. After losing a loved one (bereavement), normal sadness and grief may last longer than two weeks. It usually gets better with time.  Clinical depression. This kind lasts longer than normal sadness or grief. It keeps you from doing the things you normally do in life. It is often hard to function at home, work, or at school. It may affect your relationships  with others. Treatment is often needed. GET HELP RIGHT AWAY IF:  You have thoughts about hurting yourself or others.  You lose touch with reality (psychotic symptoms). You may:  See or hear things that are not real.  Have untrue beliefs about your life or people around you.  Your medicine  is giving you problems. MAKE SURE YOU:  Understand these instructions.  Will watch your condition.  Will get help right away if you are not doing well or get worse. Document Released: 06/02/2010 Document Revised: 09/14/2013 Document Reviewed: 08/30/2011 [facility] Patient Information 2015 [place], [place]. This information is not intended to replace advice given to you by your health care provider. Make sure you discuss any questions you have with your health care provider.

## 2015-01-07 NOTE — ED Notes (Signed)

## 2015-01-07 NOTE — ED Notes (Signed)
Patient visited by girlfriend. Visit went well. Patient gave verbal permission for girlfriend to take his car keys to move his car out of illegal parking spot. Car keys given to GF.

## 2015-01-07 NOTE — Consult Note (Signed)
Francisco [name]   Reason for [name]:  [name] for this 19 year old man who was brought to the hospital after making suicidal statements at home. Reevaluation. Referring Physician: Karma Greaser Patient Identification: Francisco [name] MRN:  [id number] Principal Diagnosis: Adjustment disorder with mixed disturbance of emotions and conduct Diagnosis:   Patient Active Problem List   Diagnosis Date Noted  . Adjustment disorder with mixed disturbance of emotions and conduct [F43.25] 01/07/2015    Total Time spent with patient: 1 hour  Subjective:   Francisco [name] is a 19 y.o. male patient admitted with "I was just upset because of my grandmother, but I'm alright now.".  HPI:  Information from the patient and the chart. Chart reviewed labs reviewed. Case discussed with emergency room psychiatry staff. This 20 year old man was brought to the hospital yesterday evening because he had made suicidal statements yesterday. He tells me that he was upset because his grandmother is very ill and is probably going to die soon. He went to visit her and found that she had been put on oxygen. . This upset him. He states that he is very close to his grandmother. When he found out how sick she was he got angry and upset. He says he was holding a kitchen knife and talking about cutting himself. He did not actually act on it. This was an impulsive gesture. Prior to this he denies that his mood has been particularly depressed anxious or angry. Sleep has been adequate appetite is been normal. Has had no new physical problems. He says that he doesn't have a lot of other major stresses in his life. He is not working now but had an interview scheduled and is hoping to get a job soon. Not currently receiving any outpatient mental health treatment.  Past psychiatric history: No previous psychiatric evaluation or treatment reported. Denies he's ever seen a counselor or therapist. Never been prescribed any medication  for psychiatric illness. Never been in a psychiatric hospital. Denies any history of suicide attempts or self injury.  Substance abuse history: Patient states that he does not drink alcohol regularly or really at all. He says that he uses marijuana once or twice a week but not much more than that. Denies feeling like it's a problem. Denies use of other drugs.  Social history: Patient lives with his mother and stepfather and a sister. He finished high school. Has had jobs in the past. He is looking for work right now. Has hopes to go back to school in the future. Wants to learn a building trades such as welding.  Medical history: Denies any significant ongoing medical problems. Denies heart disease high blood pressure diabetes asthma etc.  Family history: Denies knowing of any family history of mental health problems  HPI Elements:   Quality:  Depression with transient suicidal threats. Severity:  Mild to moderate since he did not act on them.. Timing:  Transient yesterday brief and already resolved. Related to stress regarding his grandmother's illness.. Duration:  Resolved. Probably lasted less than a day. Context:  Stress regarding his grandmother to whom he is very emotionally close.Marland Kitchen  Past Medical History: History reviewed. No pertinent past medical history.  Past Surgical History  Procedure Laterality Date  . Testicular exploration     Family History: History reviewed. No pertinent family history. Social History:  History  Alcohol Use No     History  Drug Use No    Social History   Social History  . Marital Status:  Single    Spouse Name: N/A  . Number of Children: N/A  . Years of Education: N/A   Social History Main Topics  . Smoking status: Current Every Day Smoker -- 0.50 packs/day    Types: Cigarettes  . Smokeless tobacco: Never Used  . Alcohol Use: No  . Drug Use: No  . Sexual Activity: Not Asked   Other Topics Concern  . None   Social History Narrative    Additional Social History:    History of alcohol / drug use?: No history of alcohol / drug abuse                     Allergies:  No Known Allergies  Labs:  Results for orders placed or performed during the hospital encounter of 01/06/15 (from the past 48 hour(s))  Urine Drug Screen, Qualitative ([place] only)     Status: Abnormal   Collection Time: 01/06/15 11:15 PM  Result Value Ref Range   Tricyclic, Ur Screen NONE DETECTED NONE DETECTED   Amphetamines, Ur Screen NONE DETECTED NONE DETECTED   MDMA (Ecstasy)Ur Screen NONE DETECTED NONE DETECTED   Cocaine Metabolite,Ur [place] NONE DETECTED NONE DETECTED   Opiate, Ur Screen NONE DETECTED NONE DETECTED   Phencyclidine (PCP) Ur S NONE DETECTED NONE DETECTED   Cannabinoid 50 Ng, Ur [place] POSITIVE (A) NONE DETECTED   Barbiturates, Ur Screen NONE DETECTED NONE DETECTED   Benzodiazepine, Ur Scrn NONE DETECTED NONE DETECTED   Methadone Scn, Ur NONE DETECTED NONE DETECTED    Comment: (NOTE) 254  Tricyclics, urine               Cutoff 1000 ng/mL 200  Amphetamines, urine             Cutoff 1000 ng/mL 300  MDMA (Ecstasy), urine           Cutoff 500 ng/mL 400  Cocaine Metabolite, urine       Cutoff 300 ng/mL 500  Opiate, urine                   Cutoff 300 ng/mL 600  Phencyclidine (PCP), urine      Cutoff 25 ng/mL 700  Cannabinoid, urine              Cutoff 50 ng/mL 800  Barbiturates, urine             Cutoff 200 ng/mL 900  Benzodiazepine, urine           Cutoff 200 ng/mL 1000 Methadone, urine                Cutoff 300 ng/mL 1100 1200 The urine drug screen provides only a preliminary, unconfirmed 1300 analytical test result and should not be used for non-medical 1400 purposes. Clinical consideration and professional judgment should 1500 be applied to any positive drug screen result due to possible 1600 interfering substances. A more specific alternate chemical method 1700 must be used in order to obtain a confirmed analytical result.   1800 Gas chromato graphy / mass spectrometry (GC/MS) is the preferred 1900 confirmatory method.   Urine Drug Screen, Qualitative ([facility] only)     Status: Abnormal   Collection Time: 01/06/15 11:15 PM  Result Value Ref Range   Tricyclic, Ur Screen NONE DETECTED NONE DETECTED   Amphetamines, Ur Screen NONE DETECTED NONE DETECTED   MDMA (Ecstasy)Ur Screen NONE DETECTED NONE DETECTED   Cocaine Metabolite,Ur [place] NONE DETECTED NONE DETECTED   Opiate, Ur Screen NONE  DETECTED NONE DETECTED   Phencyclidine (PCP) Ur S NONE DETECTED NONE DETECTED   Cannabinoid 50 Ng, Ur [place] POSITIVE (A) NONE DETECTED   Barbiturates, Ur Screen NONE DETECTED NONE DETECTED   Benzodiazepine, Ur Scrn NONE DETECTED NONE DETECTED   Methadone Scn, Ur NONE DETECTED NONE DETECTED    Comment: (NOTE) 008  Tricyclics, urine               Cutoff 1000 ng/mL 200  Amphetamines, urine             Cutoff 1000 ng/mL 300  MDMA (Ecstasy), urine           Cutoff 500 ng/mL 400  Cocaine Metabolite, urine       Cutoff 300 ng/mL 500  Opiate, urine                   Cutoff 300 ng/mL 600  Phencyclidine (PCP), urine      Cutoff 25 ng/mL 700  Cannabinoid, urine              Cutoff 50 ng/mL 800  Barbiturates, urine             Cutoff 200 ng/mL 900  Benzodiazepine, urine           Cutoff 200 ng/mL 1000 Methadone, urine                Cutoff 300 ng/mL 1100 1200 The urine drug screen provides only a preliminary, unconfirmed 1300 analytical test result and should not be used for non-medical 1400 purposes. Clinical consideration and professional judgment should 1500 be applied to any positive drug screen result due to possible 1600 interfering substances. A more specific alternate chemical method 1700 must be used in order to obtain a confirmed analytical result.  1800 Gas chromato graphy / mass spectrometry (GC/MS) is the preferred 1900 confirmatory method.   Comprehensive metabolic panel     Status: Abnormal   Collection Time: 01/07/15   1:01 AM  Result Value Ref Range   Sodium 140 135 - 145 mmol/L   Potassium 3.5 3.5 - 5.1 mmol/L   Chloride 109 101 - 111 mmol/L   CO2 23 22 - 32 mmol/L   Glucose, Bld 92 65 - 99 mg/dL   BUN 16 6 - 20 mg/dL   Creatinine, Ser 1.19 0.61 - 1.24 mg/dL   Calcium 9.6 8.9 - 10.3 mg/dL   Total Protein 8.1 6.5 - 8.1 g/dL   Albumin 5.1 (H) 3.5 - 5.0 g/dL   AST 21 15 - 41 U/L   ALT 16 (L) 17 - 63 U/L   Alkaline Phosphatase 35 (L) 38 - 126 U/L   Total Bilirubin 0.7 0.3 - 1.2 mg/dL   GFR calc non Af Amer >60 >60 mL/min   GFR calc Af Amer >60 >60 mL/min    Comment: (NOTE) The eGFR has been calculated using the CKD EPI equation. This calculation has not been validated in all clinical situations. eGFR's persistently <60 mL/min signify possible Chronic Kidney Disease.    Anion gap 8 5 - 15  Ethanol (ETOH)     Status: None   Collection Time: 01/07/15  1:01 AM  Result Value Ref Range   Alcohol, Ethyl (B) <5 <5 mg/dL    Comment:        LOWEST DETECTABLE LIMIT FOR SERUM ALCOHOL IS 5 mg/dL FOR MEDICAL PURPOSES ONLY   Salicylate level     Status: None   Collection Time: 01/07/15  1:01 AM  Result Value Ref Range   Salicylate Lvl <9.3 2.8 - 30.0 mg/dL  Acetaminophen level     Status: Abnormal   Collection Time: 01/07/15  1:01 AM  Result Value Ref Range   Acetaminophen (Tylenol), Serum <10 (L) 10 - 30 ug/mL    Comment:        THERAPEUTIC CONCENTRATIONS VARY SIGNIFICANTLY. A RANGE OF 10-30 ug/mL MAY BE AN EFFECTIVE CONCENTRATION FOR MANY PATIENTS. HOWEVER, SOME ARE BEST TREATED AT CONCENTRATIONS OUTSIDE THIS RANGE. ACETAMINOPHEN CONCENTRATIONS >150 ug/mL AT 4 HOURS AFTER INGESTION AND >50 ug/mL AT 12 HOURS AFTER INGESTION ARE OFTEN ASSOCIATED WITH TOXIC REACTIONS.   CBC     Status: Abnormal   Collection Time: 01/07/15  1:01 AM  Result Value Ref Range   WBC 7.9 3.8 - 10.6 K/uL   RBC 6.40 (H) 4.40 - 5.90 MIL/uL   Hemoglobin 13.4 13.0 - 18.0 g/dL   HCT 42.9 40.0 - 52.0 %   MCV 67.0  (L) 80.0 - 100.0 fL   MCH 21.0 (L) 26.0 - 34.0 pg   MCHC 31.3 (L) 32.0 - 36.0 g/dL   RDW 16.1 (H) 11.5 - 14.5 %   Platelets 263 150 - 440 K/uL    Vitals: Blood pressure 140/78, pulse 78, temperature 98.9 F (37.2 C), temperature source Oral, resp. rate 18, SpO2 100 %.  Risk to Self: Suicidal Ideation: Yes-Currently Present Suicidal Intent: Yes-Currently Present Is patient at risk for suicide?: Yes Suicidal Plan?: Yes-Currently Present Specify Current Suicidal Plan: Pt had a plan to cut his wrist. Access to Means: Yes Specify Access to Suicidal Means: Patient has access to knives What has been your use of drugs/alcohol within the last 12 months?: None reported How many times?: 0 Other Self Harm Risks: None reported Triggers for Past Attempts: None known Intentional Self Injurious Behavior: None Risk to Others: Homicidal Ideation: No Thoughts of Harm to Others: No Current Homicidal Intent: No Current Homicidal Plan: No Access to Homicidal Means: No Identified Victim: None reported History of harm to others?: No Assessment of Violence: On admission Violent Behavior Description: None reported Does patient have access to weapons?: Yes (Comment) Criminal Charges Pending?: No Does patient have a court date: No Prior Inpatient Therapy: Prior Inpatient Therapy: No Prior Outpatient Therapy: Prior Outpatient Therapy: No Does patient have an ACCT team?: No Does patient have Intensive In-House Services?  : No Does patient have [facility] services? : No Does patient have [facility] services?: No  No current facility-administered medications for this encounter.   Current Outpatient Prescriptions  Medication Sig Dispense Refill  . metoCLOPramide (REGLAN) 5 MG tablet Take 1 tablet (5 mg total) by mouth 3 (three) times daily. 15 tablet 0    Musculoskeletal: Strength & Muscle Tone: within normal limits Gait & Station: normal Patient leans: N/A  Psychiatric Specialty Exam: Physical Exam   Nursing note and vitals reviewed. Constitutional: He appears well-developed and well-nourished.  HENT:  Head: Normocephalic and atraumatic.  Eyes: Conjunctivae are normal. Pupils are equal, round, and reactive to light.  Neck: Normal range of motion.  Cardiovascular: Normal heart sounds.   Respiratory: Effort normal.  GI: Soft.  Musculoskeletal: Normal range of motion.  Neurological: He is alert.  Skin: Skin is warm and dry.  Psychiatric: His speech is normal and behavior is normal. Judgment and thought content normal. His mood appears anxious. Cognition and memory are normal.  Patient presents as a slightly anxious but appropriately interactive young man. Good eye contact. Appears to be open and expressive  during the interview. Appropriate reaction to the situation. No evidence of psychosis. Denies suicidal thought.    Review of Systems  Constitutional: Negative.   HENT: Negative.   Eyes: Negative.   Respiratory: Negative.   Cardiovascular: Negative.   Gastrointestinal: Negative.   Musculoskeletal: Negative.   Skin: Negative.   Neurological: Negative.   Psychiatric/Behavioral: Negative for depression, suicidal ideas, hallucinations, memory loss and substance abuse. The patient is nervous/anxious. The patient does not have insomnia.     Blood pressure 140/78, pulse 78, temperature 98.9 F (37.2 C), temperature source Oral, resp. rate 18, SpO2 100 %.There is no weight on file to calculate BMI.  General Appearance: Casual  Eye Contact::  Fair  Speech:  Clear and Coherent  Volume:  Normal  Mood:  Anxious  Affect:  Restricted  Thought Process:  Coherent  Orientation:  Full (Time, Place, and Person)  Thought Content:  Negative  Suicidal Thoughts:  No  Homicidal Thoughts:  No  Memory:  Immediate;   Good Recent;   Good Remote;   Good  Judgement:  Fair  Insight:  Fair  Psychomotor Activity:  Normal  Concentration:  Fair  Recall:  [employer] of Knowledge:Fair  Language: Fair   Akathisia:  No  Handed:  Right  AIMS (if indicated):     Assets:  Communication Skills Desire for Improvement Housing Physical Health Social Support  ADL's:  Intact  Cognition: WNL  Sleep:      Medical Decision Making: New problem, with additional work up planned, Review of Psycho-Social Stressors (1), Review or order clinical lab tests (1) and Review or order medicine tests (1)  Treatment Plan Summary: Plan 19 year old man expressed suicidal thoughts yesterday and even had a knife in his hand but did not actually hurt himself. He has been cooperative with treatment since being here in the emergency room. Patient has no sign of psychosis. He denies any suicidal thoughts currently. He does not describe a major depression or ongoing mental illness. I do not think he requires inpatient hospitalization and in no longer meets commitment criteria. Reviewed situation with patient. He agrees to go for outpatient counseling to discuss emotions and management of stress. Case discussed with emergency room doctor and psychiatry staff. Discontinue IVC. Patient can be released from the emergency room and will be given resources to follow-up in the community.  Plan:  Patient does not meet criteria for psychiatric inpatient admission. Supportive therapy provided about ongoing stressors. Disposition: Discharge is noted above  Aiesha Leland 01/07/2015 3:57 PM

## 2015-01-07 NOTE — ED Notes (Signed)
Pt. To [facility] from ED ambulatory without difficulty, to room  . Report from RN. Pt. Is alert and oriented, warm and dry in no distress. Pt. Denies SI, HI, and AVH. Pt. Calm and cooperative. Pt. Made aware of security cameras and Q15 minute rounds. Pt. Encouraged to let Nursing staff know of any concerns or needs.   

## 2015-01-07 NOTE — ED Notes (Signed)

## 2015-01-07 NOTE — BHH Counselor (Signed)
[facility] consult completed 01/07/2015.  Pt approved for inpatient psychiatric admission.  Referral packet faxed to [facility], [facility], [facility], [facility] and [facility].

## 2015-01-07 NOTE — ED Notes (Signed)
BEHAVIORAL HEALTH ROUNDING Patient sleeping: Yes Patient alert and oriented: YES Behavior appropriate: YES Describe behavior: No inappropriate or unacceptable behaviors noted at this time.  Nutrition and fluids offered: No Toileting and hygiene offered: No Sitter present: Behavioral tech rounding every 15 minutes on patient to ensure safety.  Law enforcement present: Yes Patent examiner agency: [employer] ([employer]

## 2015-01-07 NOTE — ED Notes (Signed)

## 2015-01-07 NOTE — ED Notes (Signed)
Pt given supper tray and ginger ale. Vitals taken b4 DC.

## 2015-01-07 NOTE — BHH Counselor (Addendum)
Call received from Amy with [facility] reporting that there is a bed available for pt.  Admitting physician is Dr. Wilber Oliphant.  Call report after 9 am. [phone]  Call also received from [facility] with a bed available.  Admitting MD is Dr. Shawnie Dapper.  Call report after 9 am [phone].

## 2015-05-04 ENCOUNTER — Encounter: Payer: Self-pay | Admitting: Medical Oncology

## 2015-05-04 ENCOUNTER — Emergency Department
Admission: EM | Admit: 2015-05-04 | Discharge: 2015-05-04 | Disposition: A | Discharge status: Home / Self Care | Payer: BLUE CROSS/BLUE SHIELD | Attending: Emergency Medicine | Admitting: Emergency Medicine

## 2015-05-04 DIAGNOSIS — Z79899 Other long term (current) drug therapy: Secondary | ICD-10-CM | POA: Diagnosis not present

## 2015-05-04 DIAGNOSIS — I10 Essential (primary) hypertension: Secondary | ICD-10-CM | POA: Insufficient documentation

## 2015-05-04 DIAGNOSIS — K644 Residual hemorrhoidal skin tags: Secondary | ICD-10-CM | POA: Diagnosis not present

## 2015-05-04 DIAGNOSIS — F17210 Nicotine dependence, cigarettes, uncomplicated: Secondary | ICD-10-CM | POA: Diagnosis not present

## 2015-05-04 DIAGNOSIS — K649 Unspecified hemorrhoids: Secondary | ICD-10-CM | POA: Diagnosis present

## 2015-05-04 MED ORDER — PHENYLEPH-SHARK LIV OIL-MO-PET 0.25-3-14-71.9 % RE OINT: 1.0000 "application " | TOPICAL_OINTMENT | Freq: Two times a day (BID) | RECTAL | Status: DC | PRN

## 2015-05-04 MED ORDER — TUCKS 50 % EX PADS: 1.0000 "application " | MEDICATED_PAD | Freq: Three times a day (TID) | CUTANEOUS | Status: DC

## 2015-05-04 MED ORDER — DOCUSATE SODIUM 100 MG PO CAPS: 100.0000 mg | ORAL_CAPSULE | Freq: Two times a day (BID) | ORAL | Status: DC

## 2015-05-04 NOTE — Discharge Instructions (Signed)

## 2015-05-04 NOTE — ED Notes (Signed)
Pt ambulatory to triage with reports of hemorrhoids that have been bothering him today, denies bleeding.

## 2015-05-04 NOTE — ED Provider Notes (Signed)
[facility] Emergency Department Provider Note  ____________________________________________  Time seen: 6:30 PM  I have reviewed the triage vital signs and the nursing notes.   HISTORY  Chief Complaint Hemorrhoids    HPI Francisco [name] is a 19 y.o. male who complains of hemorrhoids that started bothering him today. Denies ever having this before, denies bleeding. No abdominal pain nausea vomiting. Has chronic constipation with straining. No recent illnesses, no trauma.     Past Medical History  Diagnosis Date  . Hypertension      Patient Active Problem List   Diagnosis Date Noted  . 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 Testicular exploration       Current Outpatient Rx  Name  Route  Sig  Dispense  Refill  . docusate sodium (COLACE) 100 MG capsule   Oral   Take 1 capsule (100 mg total) by mouth 2 (two) times daily.   120 capsule   1   . metoCLOPramide (REGLAN) 5 MG tablet   Oral   Take 1 tablet (5 mg total) by mouth 3 (three) times daily.   15 tablet   0   . phenylephrine-shark liver oil-mineral oil-petrolatum (PREPARATION H) 0.25-3-14-71.9 % rectal ointment   Rectal   Place 1 application rectally 2 (two) times daily as needed for hemorrhoids.   30 g   0   . Witch Hazel (TUCKS) 50 % PADS   Apply externally   Apply 1 application topically 3 (three) times daily.   40 each   3      Allergies Review of patient's allergies indicates no known allergies.   No family history on file.  Social History Social History  Substance Use Topics  . Smoking status: Current Every Day Smoker -- 0.50 packs/day    Types: Cigarettes  . Smokeless tobacco: Never Used  . Alcohol Use: No    Review of Systems  Constitutional:   No fever or chills. No weight changes Eyes:   No blurry vision or double vision.  ENT:   No sore  throat. Cardiovascular:   No chest pain. Respiratory:   No dyspnea or cough. Gastrointestinal:   Negative for abdominal pain, vomiting and diarrhea.  No BRBPR or melena. Genitourinary:   Negative for dysuria, urinary retention, bloody urine, or difficulty urinating. Musculoskeletal:   Negative for back pain. No joint swelling or pain. Skin:   Negative for rash. Neurological:   Negative for headaches, focal weakness or numbness. Psychiatric:  No anxiety or depression.   Endocrine:  No hot/cold intolerance, changes in energy, or sleep difficulty.  10-point ROS otherwise negative.  ____________________________________________   PHYSICAL EXAM:  VITAL SIGNS: ED Triage Vitals  Enc Vitals Group     BP 05/04/15 1811 134/74 mmHg     Pulse Rate 05/04/15 1811 78     Resp 05/04/15 1811 16     Temp 05/04/15 1811 98.3 F (36.8 C)     Temp Source 05/04/15 1811 Oral     SpO2 05/04/15 1811 100 %     Weight 05/04/15 1811 181 lb (82.101 kg)     Height 05/04/15 1811 5\' 8"  (1.727 m)     Head Cir --      Peak Flow --      Pain Score 05/04/15 1813 7     Pain Loc --      Pain Edu? --  Excl. in GC? --     Vital signs reviewed, nursing assessments reviewed.   Constitutional:   Alert and oriented. Well appearing and in no distress. Eyes:   No scleral icterus. No conjunctival pallor. PERRL. EOMI ENT   Head:   Normocephalic and atraumatic.   Gastrointestinal:   Soft and nontender. No distention. There is no CVA tenderness.  No rebound, rigidity, or guarding. Anorectal exam reveals a small external hemorrhoid, nonthrombosed, no bleeding inflammation or tenderness. Otherwise normal. Genitourinary:   Normal Musculoskeletal:   Nontender with normal range of motion in all extremities. No joint effusions.  No lower extremity tenderness.  No edema. Neurologic:   Normal speech and language.  CN 2-10 normal. Motor grossly intact. No pronator drift.  Normal gait. No gross focal neurologic  deficits are appreciated.  Skin:    Skin is warm, dry and intact. No rash noted.  No petechiae, purpura, or bullae. Psychiatric:   Mood and affect are normal. Speech and behavior are normal. Patient exhibits appropriate insight and judgment.  ____________________________________________    LABS (pertinent positives/negatives) (all labs ordered are listed, but only abnormal results are displayed) Labs Reviewed - No data to display ____________________________________________   EKG    ____________________________________________    RADIOLOGY    ____________________________________________   PROCEDURES   ____________________________________________   INITIAL IMPRESSION / ASSESSMENT AND PLAN / ED COURSE  Pertinent labs & imaging results that were available during my care of the patient were reviewed by me and considered in my medical decision making (see chart for details).  Patient presents with a small uncomplicated external hemorrhoid. We will prescribe symptomatic relief agents including Preparation H and Tucks and information for sitz bath. Also started on Colace for his constipation to treat the problem that is causing it. Anticipatory guidance provided, follow-up with primary care.     ____________________________________________   FINAL CLINICAL IMPRESSION(S) / ED DIAGNOSES  Final diagnoses:  External hemorrhoids without complication      Sharman Lloyd, MD 05/04/15 1844

## 2015-06-21 ENCOUNTER — Encounter: Payer: Self-pay | Admitting: Emergency Medicine

## 2015-06-21 ENCOUNTER — Emergency Department
Admission: EM | Admit: 2015-06-21 | Discharge: 2015-06-21 | Disposition: A | Discharge status: Home / Self Care | Payer: BLUE CROSS/BLUE SHIELD | Attending: Emergency Medicine | Admitting: Emergency Medicine

## 2015-06-21 DIAGNOSIS — I10 Essential (primary) hypertension: Secondary | ICD-10-CM | POA: Insufficient documentation

## 2015-06-21 DIAGNOSIS — F17210 Nicotine dependence, cigarettes, uncomplicated: Secondary | ICD-10-CM | POA: Diagnosis not present

## 2015-06-21 DIAGNOSIS — R1010 Upper abdominal pain, unspecified: Secondary | ICD-10-CM | POA: Insufficient documentation

## 2015-06-21 LAB — CBC WITH DIFFERENTIAL/PLATELET
Basophils Absolute: 0.1 10*3/uL (ref 0–0.1)
Basophils Relative: 2 %
EOS ABS: 0.1 10*3/uL (ref 0–0.7)
EOS PCT: 2 %
HCT: 38.2 % — ABNORMAL LOW (ref 40.0–52.0)
Hemoglobin: 12.1 g/dL — ABNORMAL LOW (ref 13.0–18.0)
LYMPHS ABS: 1.1 10*3/uL (ref 1.0–3.6)
Lymphocytes Relative: 29 %
MCH: 20.9 pg — AB (ref 26.0–34.0)
MCHC: 31.6 g/dL — AB (ref 32.0–36.0)
MCV: 66.2 fL — ABNORMAL LOW (ref 80.0–100.0)
Monocytes Absolute: 0.4 10*3/uL (ref 0.2–1.0)
Monocytes Relative: 11 %
Neutro Abs: 2.1 10*3/uL (ref 1.4–6.5)
Neutrophils Relative %: 56 %
PLATELETS: 238 10*3/uL (ref 150–440)
RBC: 5.78 MIL/uL (ref 4.40–5.90)
RDW: 17 % — ABNORMAL HIGH (ref 11.5–14.5)
WBC: 3.6 10*3/uL — AB (ref 3.8–10.6)

## 2015-06-21 LAB — COMPREHENSIVE METABOLIC PANEL
ALT: 25 U/L (ref 17–63)
ANION GAP: 9 (ref 5–15)
AST: 25 U/L (ref 15–41)
Albumin: 4.8 g/dL (ref 3.5–5.0)
Alkaline Phosphatase: 31 U/L — ABNORMAL LOW (ref 38–126)
BUN: 18 mg/dL (ref 6–20)
CHLORIDE: 111 mmol/L (ref 101–111)
CO2: 22 mmol/L (ref 22–32)
Calcium: 9.4 mg/dL (ref 8.9–10.3)
Creatinine, Ser: 1.1 mg/dL (ref 0.61–1.24)
GFR calc non Af Amer: >60 mL/min (ref >60)
Glucose, Bld: 96 mg/dL (ref 65–99)
Potassium: 3.8 mmol/L (ref 3.5–5.1)
SODIUM: 142 mmol/L (ref 135–145)
Total Bilirubin: 0.6 mg/dL (ref 0.3–1.2)
Total Protein: 7.5 g/dL (ref 6.5–8.1)

## 2015-06-21 LAB — LIPASE, BLOOD: Lipase: 18 U/L (ref 11–51)

## 2015-06-21 NOTE — ED Notes (Signed)
C/o upper abd pain for several days.  denies n/v/d.  State he feels like he needs to have bm but cant.

## 2016-06-26 ENCOUNTER — Emergency Department
Admission: EM | Admit: 2016-06-26 | Discharge: 2016-06-26 | Disposition: A | Discharge status: Home / Self Care | Payer: BLUE CROSS/BLUE SHIELD | Attending: Emergency Medicine | Admitting: Emergency Medicine

## 2016-06-26 ENCOUNTER — Encounter: Payer: Self-pay | Admitting: Emergency Medicine

## 2016-06-26 DIAGNOSIS — I10 Essential (primary) hypertension: Secondary | ICD-10-CM | POA: Insufficient documentation

## 2016-06-26 DIAGNOSIS — F17210 Nicotine dependence, cigarettes, uncomplicated: Secondary | ICD-10-CM | POA: Insufficient documentation

## 2016-06-26 DIAGNOSIS — K645 Perianal venous thrombosis: Secondary | ICD-10-CM

## 2016-06-26 MED ORDER — HYDROCORTISONE ACE-PRAMOXINE 1-1 % RE FOAM: 1.0000 | Freq: Two times a day (BID) | RECTAL | 0 refills | Status: DC

## 2016-06-26 MED ORDER — DOCUSATE SODIUM 100 MG PO CAPS: 100.0000 mg | ORAL_CAPSULE | Freq: Two times a day (BID) | ORAL | 0 refills | Status: AC

## 2016-06-26 NOTE — ED Triage Notes (Signed)
Pt to ED c/o rectal pain for months.  States "feels like my hemorrhoids  have come out".  Reports pain and itching and straining to use the restroom intermittently, denies bleeding.  States has used hemorrhoid cream without relief.

## 2016-06-26 NOTE — ED Notes (Signed)
See triage note   States he noticed some itching to rectal area  Hx of hemorrhoids  Has used cream for same w/o relief

## 2016-06-26 NOTE — ED Provider Notes (Signed)
[facility] Emergency Department Provider Note  ____________________________________________  Time seen: Approximately 1:03 PM  I have reviewed the triage vital signs and the nursing notes.   HISTORY  Chief Complaint Rectal Pain    HPI Francisco [name] is a 21 y.o. male , NAD, presents to the emergency department for evaluation of hemorrhoids. Patient states he has had issues with hemorrhoids for many years. Has had irritation about his buttocks for many months. Has been using over-the-counter Preparation H without significant relief. States he believes that a hemorrhoid has been protruding over the last few days. Denies any pain or bleeding at the site. Has not had any abdominal pain, nausea, vomiting, diarrhea, dysuria or hematuria. Denies any fevers, chills or body aches. Denies saddle paresthesias or loss of bowel or bladder control. Has not consult at with his primary care provider in regards to these issues.   Past Medical History:  Diagnosis Date  . Hypertension     Patient Active Problem List   Diagnosis Date Noted  . 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 TESTICULAR EXPLORATION      Prior to Admission medications   Medication Sig Start Date End Date Taking? Authorizing Provider  docusate sodium (COLACE) 100 MG capsule Take 1 capsule (100 mg total) by mouth 2 (two) times daily. 06/26/16 07/26/16  Jami L Hagler, PA-C  hydrocortisone-pramoxine (PROCTOFOAM-HC) rectal foam Place 1 applicator rectally 2 (two) times daily. After a bowel movement 06/26/16   Jami L Hagler, PA-C  metoCLOPramide (REGLAN) 5 MG tablet Take 1 tablet (5 mg total) by mouth 3 (three) times daily. 11/04/14   Darien Ramus, MD    Allergies Patient has no known allergies.  History reviewed. No pertinent family history.  Social History Social History  Substance Use Topics  . Smoking  status: Current Some Day Smoker    Packs/day: 0.50    Types: Cigarettes  . Smokeless tobacco: Never Used  . Alcohol use No     Review of Systems  Constitutional: No fever/chills Gastrointestinal: Positive hemorrhoids. No abdominal pain.  No nausea, vomiting.  No diarrhea.  No constipation. Genitourinary: Negative for dysuria. No hematuria.  Musculoskeletal: Negative for back pain and general myalgias.  Skin: Negative for rash or redness, swelling. Neurological: Negative for saddle paresthesias or loss of bowel or bladder control  ____________________________________________   PHYSICAL EXAM:  VITAL SIGNS: ED Triage Vitals  Enc Vitals Group     BP 06/26/16 1216 (!) 126/46     Pulse Rate 06/26/16 1216 69     Resp 06/26/16 1216 18     Temp 06/26/16 1216 98.3 F (36.8 C)     Temp Source 06/26/16 1216 Oral     SpO2 06/26/16 1216 100 %     Weight 06/26/16 1217 193 lb (87.5 kg)     Height 06/26/16 1217 5\' 8"  (1.727 m)     Head Circumference --      Peak Flow --      Pain Score 06/26/16 1223 7     Pain Loc --      Pain Edu? --      Excl. in GC? --      Constitutional: Alert and oriented. Well appearing and in no acute distress. Eyes: Conjunctivae are normal.  Head: Atraumatic. Cardiovascular: Good peripheral circulation. Respiratory: Normal respiratory effort without tachypnea or retractions.  Gastrointestinal: 1cm annular, from a hemorrhoid is  noted to be protruding outside of the rectal region. No active oozing, weeping or bleeding. Tenderness noted only when hemorrhoid is attempted to be reduced.  Neurologic:  Normal speech and language. Normal gait and posture. No gross focal neurologic deficits are appreciated.  Skin:  Skin is warm, dry and intact. No rash, redness, abnormal warmth noted. Psychiatric: Mood and affect are normal. Speech and behavior are normal. Patient exhibits appropriate insight and judgement.   ____________________________________________    LABS  None ____________________________________________  EKG  None ____________________________________________  RADIOLOGY  none ____________________________________________    PROCEDURES  Procedure(s) performed: None   Procedures   Medications - No data to display   ____________________________________________   INITIAL IMPRESSION / ASSESSMENT AND PLAN / ED COURSE  Pertinent labs & imaging results that were available during my care of the patient were reviewed by me and considered in my medical decision making (see chart for details).     Patient's diagnosis is consistent with thrombosed external hemorrhoid. Patient will be discharged home with prescriptions for Proctofoam HC to use as directed after bowel movements twice daily. Patient is to follow up with his primary care provider or Dr. Everlene FarrierPabon in general surgery if symptoms persist past this treatment course. Patient is given ED precautions to return to the ED for any worsening or new symptoms.   ____________________________________________  FINAL CLINICAL IMPRESSION(S) / ED DIAGNOSES  Final diagnoses:  External thrombosed hemorrhoids      NEW MEDICATIONS STARTED DURING THIS VISIT:  Discharge Medication List as of 06/26/2016  2:10 PM    START taking these medications   Details  hydrocortisone-pramoxine (PROCTOFOAM-HC) rectal foam Place 1 applicator rectally 2 (two) times daily. After a bowel movement, Starting Tue 06/26/2016, Print             Ernestene KielJami L Tidmore BendHagler, PA-C 06/27/16 1417    Arnaldo NatalPaul F Malinda, MD 06/27/16 [phone]

## 2016-07-09 ENCOUNTER — Inpatient Hospital Stay: Payer: BLUE CROSS/BLUE SHIELD | Admitting: Surgery

## 2016-07-09 ENCOUNTER — Other Ambulatory Visit: Payer: Self-pay

## 2016-07-11 ENCOUNTER — Ambulatory Visit (INDEPENDENT_AMBULATORY_CARE_PROVIDER_SITE_OTHER): Payer: BLUE CROSS/BLUE SHIELD | Admitting: Surgery

## 2016-07-11 ENCOUNTER — Encounter: Payer: Self-pay | Admitting: Surgery

## 2016-07-11 VITALS — BP 152/79 | HR 90 | Temp 98.4°F | Ht 68.0 in | Wt 191.2 lb

## 2016-07-11 DIAGNOSIS — K644 Residual hemorrhoidal skin tags: Secondary | ICD-10-CM | POA: Diagnosis not present

## 2016-07-11 MED ORDER — HYDROCORTISONE ACETATE 25 MG RE SUPP: 25.0000 mg | Freq: Two times a day (BID) | RECTAL | 0 refills | Status: DC | PRN

## 2016-07-11 MED ORDER — HYDROCORTISONE ACETATE 25 MG RE SUPP
25.0000 mg | Freq: Two times a day (BID) | RECTAL | 0 refills | Status: DC | PRN
Start: 2016-07-11 — End: 2019-09-24

## 2016-07-11 NOTE — Patient Instructions (Signed)
We would like for you to take Miralax daily and be sure to increase your water intake to 72 ounces daily.  You may pick up your medicine at the pharmacy. We would like for you to take sitz baths twice daily to help with your Hemorrhoids. We will follow up with you in one month. Please see your appointment listed below.  Please see Dr.Brain Cope's contact information listed below. [facility].  Dr.Brian Cope [phone] [address] [place] [phone]

## 2016-07-11 NOTE — Progress Notes (Signed)
07/11/2016  Reason for Visit:  External hemorrhoids  History of Present Illness: Francisco [name] is a 21 y.o. male seen in the Emergency Room on 06/26/16 with a thrombosed external hemorrhoid.  He was referred as outpatient to the surgery clinic for further evaluation.  The patient reports that his hemorrhoid has improved and is not as tender and not having much bleeding and has decreased in size.  Denies other areas of swelling or tenderness.  Does report constipation and has to strain hard to have a bowel movement.  Prior to his visit to the Er, he had tried preparation H which did not help.  Separate to his symptoms related to the hemorrhoid, the patient reports that he's had erectile dysfunction and has been wondering if the hemorrhoid can cause issues with erections as well.  Past Medical History: Past Medical History:  Diagnosis Date  . Hypertension Erectile dysfunction      Past Surgical History: Past Surgical History:  Procedure Laterality Date  . TESTICULAR EXPLORATION      Home Medications: Prior to Admission medications   Medication Sig Start Date End Date Taking? Authorizing Provider  docusate sodium (COLACE) 100 MG capsule Take 1 capsule (100 mg total) by mouth 2 (two) times daily. 06/26/16 07/26/16 Yes Jami L Hagler, PA-C  hydrocortisone-pramoxine (PROCTOFOAM-HC) rectal foam Place 1 applicator rectally 2 (two) times daily. After a bowel movement 06/26/16  Yes Jami L Hagler, PA-C  metoCLOPramide (REGLAN) 5 MG tablet Take 1 tablet (5 mg total) by mouth 3 (three) times daily. 11/04/14  Yes Darien Ramusavid W Kaminski, MD  sildenafil (REVATIO) 20 MG tablet Take 3-5 tablets by mouth daily as needed 02/16/16  Yes Historical Provider, MD  witch hazel-glycerin (TUCKS) pad Apply topically. 05/04/15  Yes Historical Provider, MD  hydrocortisone (ANUSOL-HC) 25 MG suppository Place 1 suppository (25 mg total) rectally 2 (two) times daily as needed for hemorrhoids or itching. 07/11/16   Henrene DodgeJose Taneah Masri,  MD  hydrocortisone (ANUSOL-HC) 25 MG suppository Place 1 suppository (25 mg total) rectally 2 (two) times daily as needed for hemorrhoids or itching. 07/11/16   Henrene DodgeJose Yamilex Borgwardt, MD    Allergies: No Known Allergies  Social History:  reports that he has been smoking Cigarettes.  He has been smoking about 0.50 packs per day. He has never used smokeless tobacco. He reports that he does not drink alcohol or use drugs.   Family History: Family History  Problem Relation Age of Onset  . Alcohol abuse Neg Hx   . Arthritis Neg Hx   . Asthma Neg Hx   . Birth defects Neg Hx   . Cancer Neg Hx   . COPD Neg Hx   . Depression Neg Hx   . Diabetes Neg Hx   . Drug abuse Neg Hx   . Early death Neg Hx   . Hearing loss Neg Hx   . Heart disease Neg Hx   . Hyperlipidemia Neg Hx   . Hypertension Neg Hx   . Kidney disease Neg Hx   . Learning disabilities Neg Hx   . Mental illness Neg Hx   . Mental retardation Neg Hx   . Miscarriages / Stillbirths Neg Hx   . Stroke Neg Hx   . Vision loss Neg Hx   . Varicose Veins Neg Hx     Review of Systems: Review of Systems  Constitutional: Negative for chills and fever.  HENT: Negative for hearing loss.   Eyes: Negative for blurred vision.  Respiratory: Negative for cough  and shortness of breath.   Cardiovascular: Negative for chest pain.  Gastrointestinal: Positive for constipation. Negative for abdominal pain, nausea and vomiting.  Genitourinary: Negative for dysuria.       Erectile dysfunction  Musculoskeletal: Negative for myalgias.  Skin: Negative for rash.  Neurological: Negative for dizziness.  Psychiatric/Behavioral: Negative for depression.    Physical Exam BP (!) 152/79   Pulse 90   Temp 98.4 F (36.9 C) (Oral)   Ht 5\' 8"  (1.727 m)   Wt 86.7 kg (191 lb 3.2 oz)   BMI 29.07 kg/m  CONSTITUTIONAL: No acute distress HEENT:  Normocephalic, atraumatic, extraocular motion intact. NECK: Trachea is midline, and there is no jugular venous  distension.  RESPIRATORY:  Lungs are clear, and breath sounds are equal bilaterally. Normal respiratory effort without pathologic use of accessory muscles. CARDIOVASCULAR: Heart is regular without murmurs, gallops, or rubs. GI: The abdomen is soft, nondistended, nontender. There were no palpable masses.  RECTAL:  Patient has an enlarged right anterior hemorrhoidal column.  This had been thrombosed before and is much softer now.  There is no tenderness to palpation of the hemorrhoid.  On rectal exam, no palpable lesions or ulcers, but appes to have a somewhat enlarged right posterior column going internally as well.  No protrusion of any tissue when patient bearing down.  No blood in stool. MUSCULOSKELETAL:  Normal muscle strength and tone in all four extremities.  No peripheral edema or cyanosis. SKIN: Skin turgor is normal. There are no pathologic skin lesions.  NEUROLOGIC:  Motor and sensation is grossly normal.  Cranial nerves are grossly intact. PSYCH:  Alert and oriented to person, place and time. Affect is normal.  Laboratory Analysis: No results found for this or any previous visit (from the past 24 hour(s)).  Imaging: No results found.  Assessment and Plan: This is a 21 y.o. male who presents with an improving right anterior column external hemorrhoid.  --Discussed with the patient plans for conservative management of his hemorrhoid.  This includes improving his constipation with MiraLax once daily as well as increasing fiber in his diet.  Also, will prescribe Anusol suppository for the flareups to help decrease the inflammation around the enlarged hemorrhoid.  The patient will also do Sitz baths twice daily to help soothe the area as well. --Discussed with the patient to give this management a one-month trial.  He will follow up in one month and let us know how things have been progressing.  Discussed with him that if there is no improvement, we may discuss surgery to excise the  hemorrhoidal column. --also reassured the patient that hemorrhoids do not affect erectile dysfunction.  He will follow up with his urologist for further evaluation. --Patient understands this plan and all of his questions have been answered.  Face-to-face time spent with the patient and care providers was 45 minutes, with more than 50% of the time spent counseling, educating, and coordinating care of the patient.     Howie Ill, MD [facility]

## 2016-08-13 ENCOUNTER — Ambulatory Visit: Payer: Self-pay | Admitting: Surgery

## 2016-09-06 ENCOUNTER — Ambulatory Visit: Payer: BLUE CROSS/BLUE SHIELD | Admitting: Surgery

## 2019-04-15 ENCOUNTER — Other Ambulatory Visit: Payer: Self-pay

## 2019-04-15 DIAGNOSIS — Z20822 Contact with and (suspected) exposure to covid-19: Secondary | ICD-10-CM

## 2019-04-17 LAB — NOVEL CORONAVIRUS, NAA: SARS-CoV-2, NAA: NOT DETECTED

## 2019-08-06 ENCOUNTER — Emergency Department
Admission: EM | Admit: 2019-08-06 | Discharge: 2019-08-06 | Disposition: A | Discharge status: Home / Self Care | Payer: HRSA Program | Attending: Emergency Medicine | Admitting: Emergency Medicine

## 2019-08-06 ENCOUNTER — Other Ambulatory Visit: Payer: Self-pay

## 2019-08-06 DIAGNOSIS — R112 Nausea with vomiting, unspecified: Secondary | ICD-10-CM | POA: Diagnosis not present

## 2019-08-06 DIAGNOSIS — Z20822 Contact with and (suspected) exposure to covid-19: Secondary | ICD-10-CM | POA: Diagnosis not present

## 2019-08-06 DIAGNOSIS — M7918 Myalgia, other site: Secondary | ICD-10-CM | POA: Insufficient documentation

## 2019-08-06 DIAGNOSIS — F17210 Nicotine dependence, cigarettes, uncomplicated: Secondary | ICD-10-CM | POA: Insufficient documentation

## 2019-08-06 LAB — GROUP A STREP BY PCR: Group A Strep by PCR: NOT DETECTED

## 2019-08-06 LAB — SARS CORONAVIRUS 2 (TAT 6-24 HRS): SARS Coronavirus 2: NEGATIVE

## 2019-08-06 MED ORDER — ONDANSETRON HCL 4 MG PO TABS: 4.0000 mg | ORAL_TABLET | Freq: Three times a day (TID) | ORAL | 0 refills | Status: AC | PRN

## 2019-08-06 NOTE — ED Notes (Signed)
See triage note  Presents with some vomiting  States he was sent in from work for COVID testing  Denies any body aches ,cough or fever

## 2019-08-06 NOTE — ED Triage Notes (Signed)
Pt comes via POV from home with c/o vomiting. Pt states this started this am. Pt states his girlfriend is getting checked for the COVID and he may have been exposed.  Pt denies any other symptoms.

## 2019-08-06 NOTE — ED Notes (Signed)
Lab called and states that we needed to collect another strep  Provider aware

## 2019-08-06 NOTE — ED Provider Notes (Signed)
Emergency Department Provider Note  ____________________________________________  Time seen: Approximately 3:28 PM  I have reviewed the triage vital signs and the nursing notes.   HISTORY  Chief Complaint Covid exposure   Historian Patient    HPI Francisco [name] Francisco [name] is a 24 y.o. male presents to the emergency department with acute onset of vomiting that started today while patient was at work.  Patient states that he still has some mild nausea but has not experienced emesis since arriving to the emergency department.  He denies headache, fever, pharyngitis or diarrhea.  He does state that he feels some generalized myalgias.  He does not know of anyone that currently has COVID-19 at work or at home.  He denies chest pain, chest tightness or abdominal pain.  No dysuria or low back pain.  No other alleviating measures have been attempted.   Past Medical History:  Diagnosis Date  . Depression      Immunizations up to date:  Yes.     Past Medical History:  Diagnosis Date  . Depression     Patient Active Problem List   Diagnosis Date Noted  . External hemorrhoid 07/11/2016  . 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 History of torsion of testis 07/07/2014  . 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 Orchalgia 07/07/2014    Past Surgical History:  Procedure Laterality Date  . TESTICULAR EXPLORATION      Prior to Admission medications   Medication Sig Start Date End Date Taking? Authorizing Provider  hydrocortisone (ANUSOL-HC) 25 MG suppository Place 1 suppository (25 mg total) rectally 2 (two) times daily as needed for hemorrhoids or itching. 07/11/16   Olean Ree, MD  hydrocortisone (ANUSOL-HC) 25 MG suppository Place 1 suppository (25 mg total) rectally 2 (two) times daily as needed for hemorrhoids or itching. 07/11/16   Olean Ree, MD  hydrocortisone-pramoxine [facility]) rectal foam Place 1 applicator rectally 2 (two) times  daily. After a bowel movement 06/26/16   Hagler, Jami L, PA-C  metoCLOPramide (REGLAN) 5 MG tablet Take 1 tablet (5 mg total) by mouth 3 (three) times daily. 11/04/14   Ahmed Prima, MD  ondansetron (ZOFRAN) 4 MG tablet Take 1 tablet (4 mg total) by mouth every 8 (eight) hours as needed for up to 3 days for nausea or vomiting. 08/06/19 08/09/19  Lannie Fields, PA-C  sildenafil (REVATIO) 20 MG tablet Take 3-5 tablets by mouth daily as needed 02/16/16   [provider]  witch hazel-glycerin (TUCKS) pad Apply topically. 05/04/15   [provider]    Allergies Patient has no known allergies.  Family History  Problem Relation Age of Onset  . Hypertension Mother   . Cancer Mother        Breast Cancer  . Alcohol abuse Neg Hx   . Arthritis Neg Hx   . Asthma Neg Hx   . Birth defects Neg Hx   . COPD Neg Hx   . Depression Neg Hx   . Diabetes Neg Hx   . Drug abuse Neg Hx   . Early death Neg Hx   . Hearing loss Neg Hx   . Heart disease Neg Hx   . Hyperlipidemia Neg Hx   . Kidney disease Neg Hx   . Learning disabilities Neg Hx   . Mental illness Neg Hx   . Mental retardation Neg Hx   . Miscarriages / Stillbirths Neg Hx   . Stroke Neg Hx   . Vision loss Neg  Hx   . Varicose Veins Neg Hx     Social History Social History   Tobacco Use  . Smoking status: Current Some Day Smoker    Packs/day: 0.50    Types: Cigarettes  . Smokeless tobacco: Never Used  Substance Use Topics  . Alcohol use: No  . Drug use: Yes    Types: Marijuana     Review of Systems  Constitutional: No fever/chills Eyes:  No discharge ENT: No upper respiratory complaints. Respiratory: no cough. No SOB/ use of accessory muscles to breath Gastrointestinal: Patient has emesis.  Musculoskeletal: Negative for musculoskeletal pain. Skin: Negative for rash, abrasions, lacerations, ecchymosis.   ____________________________________________   PHYSICAL EXAM:  VITAL SIGNS: ED Triage Vitals   Enc Vitals Group     BP 08/06/19 1334 (!) 132/92     Pulse Rate 08/06/19 1334 99     Resp 08/06/19 1334 18     Temp 08/06/19 1334 98.2 F (36.8 C)     Temp src --      SpO2 08/06/19 1334 97 %     Weight 08/06/19 1332 215 lb (97.5 kg)     Height 08/06/19 1332 5\' 8"  (1.727 m)     Head Circumference --      Peak Flow --      Pain Score 08/06/19 1332 0     Pain Loc --      Pain Edu? --      Excl. in [place]? --      Constitutional: Alert and oriented. Well appearing and in no acute distress. Eyes: Conjunctivae are normal. PERRL. EOMI. Head: Atraumatic. ENT:      Ears: TMs are pearly.       Nose: No congestion/rhinnorhea.      Mouth/Throat: Mucous membranes are moist.  Posterior pharynx is nonerythematous. Neck: No stridor.  No cervical spine tenderness to palpation. Cardiovascular: Normal rate, regular rhythm. Normal S1 and S2.  Good peripheral circulation. Respiratory: Normal respiratory effort without tachypnea or retractions. Lungs CTAB. Good air entry to the bases with no decreased or absent breath sounds Gastrointestinal: Bowel sounds x 4 quadrants. Soft and nontender to palpation. No guarding or rigidity. No distention. Musculoskeletal: Full range of motion to all extremities. No obvious deformities noted Neurologic:  Normal for age. No gross focal neurologic deficits are appreciated.  Skin:  Skin is warm, dry and intact. No rash noted. Psychiatric: Mood and affect are normal for age. Speech and behavior are normal.   ____________________________________________   LABS (all labs ordered are listed, but only abnormal results are displayed)  Labs Reviewed  GROUP A STREP BY PCR  SARS CORONAVIRUS 2 (TAT 6-24 HRS)   ____________________________________________  EKG   ____________________________________________  RADIOLOGY   No results found.  ____________________________________________    PROCEDURES  Procedure(s) performed:      Procedures     Medications - No data to display   ____________________________________________   INITIAL IMPRESSION / ASSESSMENT AND PLAN / ED COURSE  Pertinent labs & imaging results that were available during my care of the patient were reviewed by me and considered in my medical decision making (see chart for details).      Assessment and Plan:  Emesis 24 year old male presents to the emergency department with acute onset of emesis that started while patient was at work.  Vital signs were reviewed at triage and were reassuring.  Posterior pharynx was mildly erythematous  Group A strep testing was negative.  Send off COVID-19 testing is in  process at this time.  Rest and hydration were encouraged at home.  Tylenol and ibuprofen alternating were recommended for fever if fever occurs.  Patient was discharged with a short course of Zofran for nausea.  All patient questions were answered.  ____________________________________________  FINAL CLINICAL IMPRESSION(S) / ED DIAGNOSES  Final diagnoses:  Non-intractable vomiting with nausea, unspecified vomiting type      NEW MEDICATIONS STARTED DURING THIS VISIT:  ED Discharge Orders         Ordered    ondansetron (ZOFRAN) 4 MG tablet  Every 8 hours PRN     08/06/19 1720              This chart was dictated using voice recognition software/Dragon. Despite best efforts to proofread, errors can occur which can change the meaning. Any change was purely unintentional.     Gasper Lloyd 08/06/19 Darla Lesches, MD 08/06/19 2029

## 2019-09-24 ENCOUNTER — Emergency Department: Payer: No Typology Code available for payment source

## 2019-09-24 ENCOUNTER — Other Ambulatory Visit: Payer: Self-pay

## 2019-09-24 ENCOUNTER — Emergency Department
Admission: EM | Admit: 2019-09-24 | Discharge: 2019-09-24 | Disposition: A | Discharge status: Home / Self Care | Payer: No Typology Code available for payment source | Attending: Emergency Medicine | Admitting: Emergency Medicine

## 2019-09-24 DIAGNOSIS — M791 Myalgia, unspecified site: Secondary | ICD-10-CM | POA: Diagnosis not present

## 2019-09-24 DIAGNOSIS — Z79899 Other long term (current) drug therapy: Secondary | ICD-10-CM | POA: Insufficient documentation

## 2019-09-24 DIAGNOSIS — Y92410 Unspecified street and highway as the place of occurrence of the external cause: Secondary | ICD-10-CM | POA: Insufficient documentation

## 2019-09-24 DIAGNOSIS — Y93I9 Activity, other involving external motion: Secondary | ICD-10-CM | POA: Diagnosis not present

## 2019-09-24 DIAGNOSIS — M7918 Myalgia, other site: Secondary | ICD-10-CM

## 2019-09-24 DIAGNOSIS — F17210 Nicotine dependence, cigarettes, uncomplicated: Secondary | ICD-10-CM | POA: Diagnosis not present

## 2019-09-24 DIAGNOSIS — Y999 Unspecified external cause status: Secondary | ICD-10-CM | POA: Diagnosis not present

## 2019-09-24 DIAGNOSIS — R519 Headache, unspecified: Secondary | ICD-10-CM | POA: Diagnosis present

## 2019-09-24 MED ORDER — TRAMADOL HCL 50 MG PO TABS
50.0000 mg | ORAL_TABLET | Freq: Once | ORAL | Status: AC
  Administered 2019-09-24: 50 mg via ORAL
  Filled 2019-09-24: qty 1

## 2019-09-24 MED ORDER — CYCLOBENZAPRINE HCL 10 MG PO TABS
10.0000 mg | ORAL_TABLET | Freq: Once | ORAL | Status: AC
  Administered 2019-09-24: 10 mg via ORAL
  Filled 2019-09-24: qty 1

## 2019-09-24 MED ORDER — IBUPROFEN 600 MG PO TABS
600.0000 mg | ORAL_TABLET | Freq: Once | ORAL | Status: AC
  Administered 2019-09-24: 600 mg via ORAL
  Filled 2019-09-24: qty 1

## 2019-09-24 MED ORDER — IBUPROFEN 600 MG PO TABS: 600.0000 mg | ORAL_TABLET | Freq: Three times a day (TID) | ORAL | 0 refills | Status: AC | PRN

## 2019-09-24 MED ORDER — CYCLOBENZAPRINE HCL 10 MG PO TABS: 10.0000 mg | ORAL_TABLET | Freq: Three times a day (TID) | ORAL | 0 refills | Status: DC | PRN
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
## 2019-09-24 NOTE — Discharge Instructions (Addendum)
Follow discharge care instruction take medication as directed. °
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
## 2020-01-06 ENCOUNTER — Emergency Department: Payer: Self-pay

## 2020-01-06 ENCOUNTER — Emergency Department
Admission: EM | Admit: 2020-01-06 | Discharge: 2020-01-06 | Disposition: A | Discharge status: Home / Self Care | Payer: Self-pay | Attending: Emergency Medicine | Admitting: Emergency Medicine

## 2020-01-06 ENCOUNTER — Other Ambulatory Visit: Payer: Self-pay

## 2020-01-06 DIAGNOSIS — Y999 Unspecified external cause status: Secondary | ICD-10-CM | POA: Insufficient documentation

## 2020-01-06 DIAGNOSIS — T1490XA Injury, unspecified, initial encounter: Secondary | ICD-10-CM

## 2020-01-06 DIAGNOSIS — Y929 Unspecified place or not applicable: Secondary | ICD-10-CM | POA: Insufficient documentation

## 2020-01-06 DIAGNOSIS — F17210 Nicotine dependence, cigarettes, uncomplicated: Secondary | ICD-10-CM | POA: Insufficient documentation

## 2020-01-06 DIAGNOSIS — Y939 Activity, unspecified: Secondary | ICD-10-CM | POA: Insufficient documentation

## 2020-01-06 DIAGNOSIS — S93402A Sprain of unspecified ligament of left ankle, initial encounter: Secondary | ICD-10-CM | POA: Insufficient documentation

## 2020-01-06 DIAGNOSIS — X58XXXA Exposure to other specified factors, initial encounter: Secondary | ICD-10-CM | POA: Insufficient documentation

## 2020-01-06 MED ORDER — MELOXICAM 15 MG PO TABS: 15.0000 mg | ORAL_TABLET | Freq: Every day | ORAL | 0 refills | Status: DC

## 2020-01-06 MED ORDER — MELOXICAM 15 MG PO TABS: 15.0000 mg | ORAL_TABLET | Freq: Every day | ORAL | 0 refills | Status: AC

## 2020-01-06 NOTE — ED Triage Notes (Signed)
Pt here with a left ankle injury after exercising. Pt thinks that he landed on it wrong and it hurts everytime he tries to put pressure on it. Pt in NAD in triage.

## 2020-01-06 NOTE — ED Notes (Signed)
Pt left without discharge inst and rx.  Pa-c cuthriell aware.

## 2020-01-06 NOTE — ED Provider Notes (Signed)
[facility] Emergency Department Provider Note  ____________________________________________  Time seen: Approximately 6:45 PM  I have reviewed the triage vital signs and the nursing notes.   HISTORY  Chief Complaint Ankle Pain    HPI Francisco [name] is a 24 y.o. male who presents the emergency department complaining of left foot pain. Patient states that he is working out, as he was doing a jumping exercise he landed awkwardly rolling his ankle/foot. Patient pain was located primarily along the lateral aspect of the foot. Still ambulatory but doing so increased the pain. No medications prior to arrival. No history of previous injuries to the foot or ankle.         Past Medical History:  Diagnosis Date  . Depression     Patient Active Problem List   Diagnosis Date Noted  . External hemorrhoid 07/11/2016  . 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 History of torsion of testis 07/07/2014  . 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 Orchalgia 07/07/2014    Past Surgical History:  Procedure Laterality Date  . TESTICULAR EXPLORATION      Prior to Admission medications   Medication Sig Start Date End Date Taking? Authorizing Provider  ibuprofen (ADVIL) 600 MG tablet Take 1 tablet (600 mg total) by mouth every 8 (eight) hours as needed. 09/24/19   Joni Reining, PA-C  meloxicam (MOBIC) 15 MG tablet Take 1 tablet (15 mg total) by mouth daily. 01/06/20   Deone Omahoney, Delorise Royals, PA-C  sildenafil (REVATIO) 20 MG tablet Take 3-5 tablets by mouth daily as needed 02/16/16   [provider]  witch hazel-glycerin (TUCKS) pad Apply topically. 05/04/15   [provider]    Allergies Patient has no known allergies.  Family History  Problem Relation Age of Onset  . Hypertension Mother   . Cancer Mother        Breast Cancer  . Alcohol abuse Neg Hx   . Arthritis Neg Hx   . Asthma Neg Hx   . Birth defects Neg Hx    . COPD Neg Hx   . Depression Neg Hx   . Diabetes Neg Hx   . Drug abuse Neg Hx   . Early death Neg Hx   . Hearing loss Neg Hx   . Heart disease Neg Hx   . Hyperlipidemia Neg Hx   . Kidney disease Neg Hx   . Learning disabilities Neg Hx   . Mental illness Neg Hx   . Mental retardation Neg Hx   . Miscarriages / Stillbirths Neg Hx   . Stroke Neg Hx   . Vision loss Neg Hx   . Varicose Veins Neg Hx     Social History Social History   Tobacco Use  . Smoking status: Current Some Day Smoker    Packs/day: 0.50    Types: Cigarettes  . Smokeless tobacco: Never Used  Substance Use Topics  . Alcohol use: No  . Drug use: Yes    Types: Marijuana     Review of Systems  Constitutional: No fever/chills Eyes: No visual changes. No discharge ENT: No upper respiratory complaints. Cardiovascular: no chest pain. Respiratory: no cough. No SOB. Gastrointestinal: No abdominal pain.  No nausea, no vomiting.  No diarrhea.  No constipation. Musculoskeletal: Left foot/ankle pain Skin: Negative for rash, abrasions, lacerations, ecchymosis. Neurological: Negative for headaches, focal weakness or numbness. 10-point ROS otherwise negative.  ____________________________________________   PHYSICAL EXAM:  VITAL SIGNS: ED Triage Vitals  Enc Vitals Group     BP 01/06/20 1709 138/74     Pulse Rate 01/06/20 1709 73     Resp 01/06/20 1709 18     Temp 01/06/20 1709 98.8 F (37.1 C)     Temp Source 01/06/20 1709 Oral     SpO2 01/06/20 1709 100 %     Weight 01/06/20 1710 220 lb (99.8 kg)     Height 01/06/20 1710 5\' 8"  (1.727 m)     Head Circumference --      Peak Flow --      Pain Score 01/06/20 1717 6     Pain Loc --      Pain Edu? --      Excl. in [place]? --      Constitutional: Alert and oriented. Well appearing and in no acute distress. Eyes: Conjunctivae are normal. PERRL. EOMI. Head: Atraumatic. ENT:      Ears:       Nose: No congestion/rhinnorhea.      Mouth/Throat: Mucous  membranes are moist.  Neck: No stridor.    Cardiovascular: Normal rate, regular rhythm. Normal S1 and S2.  Good peripheral circulation. Respiratory: Normal respiratory effort without tachypnea or retractions. Lungs CTAB. Good air entry to the bases with no decreased or absent breath sounds. Musculoskeletal: Full range of motion to all extremities. No gross deformities appreciated. Visualization of the left foot reveals no gross signs of trauma. No gross edema, ecchymosis, deformity. Patient is moderately tender to palpation along the anterior talofibular ligament distribution and proximal fifth metatarsal region. No palpable abnormality or deficit. Full range of motion all digits. Capillary refill less than 2 seconds all digits. Dorsalis pedis pulse intact. Sensation intact all digits. Neurologic:  Normal speech and language. No gross focal neurologic deficits are appreciated.  Skin:  Skin is warm, dry and intact. No rash noted. Psychiatric: Mood and affect are normal. Speech and behavior are normal. Patient exhibits appropriate insight and judgement.   ____________________________________________   LABS (all labs ordered are listed, but only abnormal results are displayed)  Labs Reviewed - No data to display ____________________________________________  EKG   ____________________________________________  RADIOLOGY I personally viewed and evaluated these images as part of my medical decision making, as well as reviewing the written report by the radiologist.  DG Foot Complete Left  Result Date: 01/06/2020 CLINICAL DATA:  Foot injury EXAM: LEFT FOOT - COMPLETE 3+ VIEW COMPARISON:  None. FINDINGS: There is no evidence of fracture or dislocation. There is no evidence of arthropathy or other focal bone abnormality. Soft tissues are unremarkable. IMPRESSION: Negative. Electronically Signed   By: 01/08/2020 M.D.   On: 01/06/2020 17:53     ____________________________________________    PROCEDURES  Procedure(s) performed:    Procedures    Medications - No data to display   ____________________________________________   INITIAL IMPRESSION / ASSESSMENT AND PLAN / ED COURSE  Pertinent labs & imaging results that were available during my care of the patient were reviewed by me and considered in my medical decision making (see chart for details).  Review of the [place] CSRS was performed in accordance of the [facility] prior to dispensing any controlled drugs.           Patient's diagnosis is consistent with ankle sprain. Patient presented to the emergency department complaining of left foot injury after rolling his foot while doing an exercise. Imaging reveals no acute traumatic findings. Exam is overall reassuring. Patient will be given ASO lace up brace for  symptom relief. Meloxicam for additional symptom control. Follow-up primary care orthopedics as needed.. Patient is given ED precautions to return to the ED for any worsening or new symptoms.     ____________________________________________  FINAL CLINICAL IMPRESSION(S) / ED DIAGNOSES  Final diagnoses:  Sprain of left ankle, unspecified ligament, initial encounter      NEW MEDICATIONS STARTED DURING THIS VISIT:  ED Discharge Orders         Ordered    meloxicam (MOBIC) 15 MG tablet  Daily        01/06/20 1853              This chart was dictated using voice recognition software/Dragon. Despite best efforts to proofread, errors can occur which can change the meaning. Any change was purely unintentional.    Racheal Patches, PA-C 01/06/20 1853    Phineas Semen, MD 01/06/20 Mikle Bosworth

## 2020-01-06 NOTE — ED Notes (Signed)
See triage note   Presents with left ankle pain  States pain started when he steeping down wrong  Good pulses
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
